# Patient Record
Sex: Male | Born: 1960 | Race: White | Hispanic: No | Marital: Married | Smoking: Former smoker
Health system: Southern US, Academic
[De-identification: ages and names within clinical notes are randomized; demographics above are authoritative.]

## PROBLEM LIST (undated history)

## (undated) DIAGNOSIS — K5090 Crohn's disease, unspecified, without complications: Secondary | ICD-10-CM

## (undated) DIAGNOSIS — K219 Gastro-esophageal reflux disease without esophagitis: Secondary | ICD-10-CM

## (undated) DIAGNOSIS — D333 Benign neoplasm of cranial nerves: Secondary | ICD-10-CM

## (undated) DIAGNOSIS — F410 Panic disorder [episodic paroxysmal anxiety] without agoraphobia: Secondary | ICD-10-CM

## (undated) DIAGNOSIS — K760 Fatty (change of) liver, not elsewhere classified: Secondary | ICD-10-CM

## (undated) HISTORY — DX: Crohn's disease, unspecified, without complications: K50.90

## (undated) HISTORY — PX: HX NO SURGICAL PROCEDURES: 2100001501

## (undated) HISTORY — DX: Panic disorder (episodic paroxysmal anxiety): F41.0

## (undated) HISTORY — DX: Benign neoplasm of cranial nerves: D33.3

## (undated) HISTORY — DX: Gastro-esophageal reflux disease without esophagitis: K21.9

## (undated) HISTORY — PX: SKIN CANCER EXCISION: SHX779

## (undated) HISTORY — DX: Fatty (change of) liver, not elsewhere classified: K76.0

---

## 1999-09-24 ENCOUNTER — Other Ambulatory Visit (HOSPITAL_COMMUNITY): Payer: Self-pay | Admitting: Family Medicine

## 2017-02-10 ENCOUNTER — Ambulatory Visit (INDEPENDENT_AMBULATORY_CARE_PROVIDER_SITE_OTHER): Payer: MEDICAID | Admitting: Rheumatology

## 2017-02-10 ENCOUNTER — Ambulatory Visit: Payer: MEDICAID | Attending: Gastroenterology | Admitting: Gastroenterology

## 2017-02-10 ENCOUNTER — Encounter (INDEPENDENT_AMBULATORY_CARE_PROVIDER_SITE_OTHER): Payer: Self-pay | Admitting: Gastroenterology

## 2017-02-10 VITALS — BP 137/96 | HR 53 | Temp 96.4°F | Ht 71.0 in | Wt 202.4 lb

## 2017-02-10 DIAGNOSIS — F410 Panic disorder [episodic paroxysmal anxiety] without agoraphobia: Secondary | ICD-10-CM | POA: Insufficient documentation

## 2017-02-10 DIAGNOSIS — K760 Fatty (change of) liver, not elsewhere classified: Secondary | ICD-10-CM

## 2017-02-10 DIAGNOSIS — K449 Diaphragmatic hernia without obstruction or gangrene: Secondary | ICD-10-CM | POA: Insufficient documentation

## 2017-02-10 DIAGNOSIS — Z87891 Personal history of nicotine dependence: Secondary | ICD-10-CM | POA: Insufficient documentation

## 2017-02-10 DIAGNOSIS — K219 Gastro-esophageal reflux disease without esophagitis: Secondary | ICD-10-CM | POA: Insufficient documentation

## 2017-02-10 DIAGNOSIS — Z79899 Other long term (current) drug therapy: Secondary | ICD-10-CM | POA: Insufficient documentation

## 2017-02-10 LAB — PT/INR
INR: 0.93 (ref 0.80–1.20)
INR: 0.93 (ref 0.80–1.20)
PROTHROMBIN TIME: 10.8 s (ref 9.3–13.9)

## 2017-02-10 LAB — BASIC METABOLIC PANEL
ANION GAP: 8 mmol/L (ref 4–13)
BUN/CREA RATIO: 11 (ref 6–22)
BUN/CREA RATIO: 11 (ref 6–22)
BUN: 10 mg/dL (ref 8–25)
CALCIUM: 10.1 mg/dL (ref 8.5–10.2)
CHLORIDE: 103 mmol/L (ref 96–111)
CO2 TOTAL: 28 mmol/L (ref 22–32)
CREATININE: 0.94 mg/dL (ref 0.62–1.27)
ESTIMATED GFR: >59 mL/min/1.73mˆ2 (ref >59)
GLUCOSE: 97 mg/dL (ref 65–139)
POTASSIUM: 4.1 mmol/L (ref 3.5–5.1)
SODIUM: 139 mmol/L (ref 136–145)

## 2017-02-10 LAB — HEPATIC FUNCTION PANEL
ALBUMIN: 4.6 g/dL (ref 3.5–5.0)
ALKALINE PHOSPHATASE: 95 U/L (ref <150)
BILIRUBIN DIRECT: 0.2 mg/dL (ref <0.3)
BILIRUBIN TOTAL: 0.7 mg/dL (ref 0.3–1.3)

## 2017-02-10 LAB — CBC
HCT: 49.2 % — ABNORMAL HIGH (ref 36.7–47.0)
HGB: 16.4 g/dL — ABNORMAL HIGH (ref 12.5–16.3)
HGB: 16.4 g/dL — ABNORMAL HIGH (ref 12.5–16.3)
MCH: 28 pg (ref 27.4–33.0)
MCHC: 33.3 g/dL (ref 32.5–35.8)
MCV: 84 fL (ref 78.0–100.0)
MPV: 7.4 fL — ABNORMAL LOW (ref 7.5–11.5)
PLATELETS: 347 x10ˆ3/uL (ref 140–450)
RBC: 5.86 x10ˆ6/uL — ABNORMAL HIGH (ref 4.06–5.63)
RDW: 13.7 % (ref 12.0–15.0)
WBC: 8.3 x10ˆ3/uL (ref 3.5–11.0)

## 2017-02-10 NOTE — H&P (Signed)
Digestive Diseases  Operated by [facility] Patient History and Physical     Date:   02/10/2017  Name: Roy [name]  Age: 56 y.o.    Chief Complaint: Fatty Liver    History of Present Illness  56 yo M w/ h/o (reported) fatty liver, (reported) "Chron-like" ileal inflammation, GERD, and panic disorder presents for consultation regarding a recent diagnosis of "stage IV liver failure" that he was provided. Patient follows with a gastroenterologist in [place], [place], who recently ordered two separate transient elastographies (FibroScan), which revealed "stage IV liver failure." Preceding the transient elastography, patient underwent serial liver ultrasounds that showed a "fatty liver" on each occasion. The liver ultrasounds were ordered after mildly elevated liver enzymes were detected on routine labs. Patient used to drink a maximum of 6 beers daily for approximately 5-years, until year 2000 (quit date). Denies IVDU. Reported negative screening for HCV. BMI is 28 (weight fairly stable). Denies prior chronic liver disease. Denies family h/o chronic liver disease. Denies OTC Tylenol usage. Denies ascites, bleeding, and confusion.    Patient also endorses a h/o ileal inflammation ("Crohn-like" illness) diagnosed in the year 2000. Prior to diagnosis, patient had been having intermittent loose stools and abdominal pain. Initially, patient was prescribed Asacol with symptomatic relief (decrease in episode frequency). On repeat colonoscopy (~3-years later), ileal inflammation was still present, and the patient was transitioned to Pentasa and Entocort. Patient has undergone a colonoscopy every 3 years since year 2000. Patient has had polyps removed more than once. Last colonoscopy performed 05/08/16. Following this colonoscopy, patient decreased dosing of Pentasa to 1,000 mg PO BID (from 2,000 mg PO BID). Patient takes Entocort 9 mg PO daily. When patient has a flare up, he has confusion, blurry vision, feeling of  warmth, abdominal pain, and loose stools. Flares now occur once monthly. Patient sees Dr. Netty Starring in [place], [place]. Patient speculates that his ileal inflammation may have been caused by 1 year of Accutane usage in 1993.    Patient takes Zoloft and Klonopin PRN for panic disorder. Patient takes Prilosec 20 mg PO daily for GERD associated with a hiatal hernia.    FH: Maternal grandmother (pancreatitis).    PSH: Denies.    SHx: Architect (previous). Dog boarder. Quit cigarettes (1 PPD < 10 years) in 1994.    Past Medical History  Past Medical History:   Diagnosis Date   . Esophageal reflux    . Fatty liver     Per liver ultrasounds   . Panic disorder     PSH: Denies prior surgery.     Current Outpatient Prescriptions   Medication Sig   . budesonide (ENTOCORT EC) 3 mg Oral Capsule, Delayed & Ext.Release Take 9 mg by mouth Every night   . cholestyramine-aspartame (PREVALITE) 4 gram Oral Powder in Packet Take 4 g by mouth Every evening with dinner   . clonazePAM (KLONOPIN) 1 mg Oral Tablet Take 0.5 mg by mouth if needed   . mesalamine (PENTASA) 500 mg Oral Capsule, Sustained Release Take 1,000 mg by mouth Twice daily   . omeprazole (PRILOSEC) 20 mg Oral Capsule, Delayed Release(E.C.) Take 20 mg by mouth Once a day   . sertraline (ZOLOFT) 100 mg Oral Tablet Take 100 mg by mouth Once a day     No Known Allergies    Family History  Family Medical History     Problem Relation (Age of Onset)    Pancreatitis Maternal Grandmother  Social History  Social History     Social History   . Marital status: Married     Spouse name: N/A   . Number of children: N/A   . 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 Smoking status: Former Smoker     Packs/day: 1.00     Years: 10.00     Types: Cigarettes     Quit date: 02/10/1993   . Smokeless tobacco: Never Used   . Alcohol use Not on file      Comment: Prior alcohol usage (6 pack beer daily in  mid-late 1990's); Quit date year 2000   . Drug use: Not on file   . Sexual activity: Not on file     Other Topics Concern   . Not on file     Social History Narrative   . No narrative on file     Review of Systems  - Constitutional: Denies fevers, unintentional weight loss.  - HEENT:  Denies swollen glands.  - CV:  Denies chest pain.  - Pulm.:  Denies shortness-of-breath.  - GI:   Endorses monthly episodes of abdominal pain and loose stools.  - GU:  Denies tea-colored urine.   - MSK:  Denies arthralgias.   - Derm: Denies skin lesions.  - Extremities: Denies swelling.  - Neurologic: Denies syncope.   - Heme: Denies easy bruising.     Examination:  BP (!) 137/96  Pulse 53  Temp 35.8 C (96.4 F)  Ht 1.803 m (5\' 11" )  Wt 91.8 kg (202 lb 6.1 oz)  SpO2 98%  BMI 28.23 kg/m2  General  - Appears stated age. No apparent distress.  Head  . NC/AT  Eyes  . Conjunctiva without pallor. No scleral icterus.  Throat  . Oropharynx moist & clear.  Lungs/Chest  . No chest wall lesions or scars.  . Non-labored breathing.  Heart  - Regular rhythm; Normal rate.  Abdomen  - Non-distended. No scars.  - Soft & non-tender.  - Liver edge palpable 1-cm below costal margin on expiration.  Neurology  - Alert & oriented x4. No asterixis.  Extremities  - Non-edematous. No palmar erythema.  Dermatology  - No lesions or rashes.  Hematology  - Good skin coloration (no paleness).    Data reviewed:  I have reviewed all labs, which are within normal limits.  CBC   Recent Labs      02/10/17   1424   WBC  8.3   HGB  16.4*   HCT  49.2*   PLTCNT  347        BMP   Recent Labs      02/10/17   1424   SODIUM  139   POTASSIUM  4.1   CHLORIDE  103   CO2  28   BUN  10   CREATININE  0.94   GLUCOSENF  97   ANIONGAP  8   BUNCRRATIO  11   GFR  >59      Recent Labs      02/10/17   1424   CALCIUM  10.1         LFTs   Recent Labs      02/10/17   1424   AST  17   ALT  28   ALKPHOS  95   TOTBILIRUBIN  0.7  BILIRUBINCON  0.2   TOTALPROTEIN  7.9   ALBUMIN  4.6           Coagulation Studies   Recent Labs      02/10/17   1424   PROTHROMTME  10.8   INR  0.93        Assessment and Plan  56 yo M w/ 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    We explained to the patient that based on his history, physical exam, and pan-normal laboratory values, it is very unlikely that he has underlying cirrhosis. We did explain that a small percentage of patients can have underlying cirrhosis despite normal exam and labs. We told the patient that he most likely has some degree of hepatic steatosis (likely metabolic in origin with possibly some contribution from prior drinking). Fatty liver is supported by verbal report of prior liver ultrasound reports and per physical exam (mild hepatomegaly).    1. Fatty liver    2. Gastroesophageal reflux disease, esophagitis presence not specified        Fatty liver disease  - Requested record of prior liver ultrasounds, transient elastographies, and office notes from the offices of Drs Peri Maris (Fax: [phone]) and Netty Starring (Fax: [phone]).  - Explained to the patient that if there is any uncertainty regarding the exclusion of cirrhosis pending record review, that a liver biopsy would be warranted.  - Variceal screening via EGD not indicated at this point (i.e., insufficient evidence for cirrhosis at this time).    Reported H/O ileal inflammation  - Requested colonoscopy and biopsy report from the office of Dr. Netty Starring (Fax: [phone]).  - Given that Crohn disease has never been confirmed (per the patient), and given that symptoms are overall well-controlled at this time, recommend maintenance with mesalamine product and gradual taper of budesonide (Entocort), which has some amount of systemic absorption (and  thus steroid side effects).      Murrell Converse, MD  02/10/2017, 12:58  Scotty Court, MD  Fellow, [facility]    ATTENDING ATTESTATION:  I have seen and examined this patient on 02/10/2017.  I have reviewed and discussed the case with Dr. Jobe Marker and agree with the assessment and plan of care as written.  For further detail see above note.   Scarlette Slice, MD

## 2017-02-11 ENCOUNTER — Encounter (INDEPENDENT_AMBULATORY_CARE_PROVIDER_SITE_OTHER): Payer: Self-pay | Admitting: Gastroenterology

## 2017-02-11 NOTE — Nursing Note (Signed)
Received Pt records from Dr. Netty Starring, [place], [place]. Placing in Dr. Freada Bergeron clinic folder. Scanning to chart.  Matilde Sprang, MA  02/11/2017, 18:07

## 2017-03-09 ENCOUNTER — Encounter (INDEPENDENT_AMBULATORY_CARE_PROVIDER_SITE_OTHER): Payer: Self-pay | Admitting: Gastroenterology

## 2017-03-09 NOTE — Nursing Note (Signed)
Received records from Dr. Sallye Ober office. Placing in Dr. Freada Bergeron clinic folder to be reviewed. Scanning to chart.  Matilde Sprang, MA  03/09/2017, 13:55

## 2017-03-12 ENCOUNTER — Ambulatory Visit (INDEPENDENT_AMBULATORY_CARE_PROVIDER_SITE_OTHER): Payer: Self-pay | Admitting: Gastroenterology

## 2017-03-12 NOTE — Telephone Encounter (Signed)
Regarding: results   ----- Message from Varney Daily sent at 03/12/2017 11:07 AM EDT -----  Dr. Tyrone Nine    Patient calling for lab results, please advise

## 2017-04-13 ENCOUNTER — Encounter (INDEPENDENT_AMBULATORY_CARE_PROVIDER_SITE_OTHER): Payer: Self-pay | Admitting: Gastroenterology

## 2017-04-13 NOTE — Nursing Note (Signed)
Received Progress notes from Dr. Serita Grit office. Placing in Dr. Freada Bergeron clinic folder to be reviewed.  Matilde Sprang, MA  04/13/2017, 12:23

## 2017-04-20 ENCOUNTER — Encounter (INDEPENDENT_AMBULATORY_CARE_PROVIDER_SITE_OTHER): Payer: Self-pay | Admitting: Gastroenterology

## 2017-04-20 NOTE — Progress Notes (Signed)
Outside records received and reviewed:    1. Colonoscopy 05/14/2016. Mild enteritis in TI, normal colon.  2. Pathology: Mild nonspecific chronic inflammation in ileum, no active pathology; Mild nonspecific chronic colitis no histologic evidence Crohn's.  3. Fibroscan 12/24/2016: F4

## 2017-10-02 ENCOUNTER — Ambulatory Visit (INDEPENDENT_AMBULATORY_CARE_PROVIDER_SITE_OTHER): Payer: MEDICAID | Admitting: Otolaryngology

## 2017-10-02 ENCOUNTER — Ambulatory Visit (INDEPENDENT_AMBULATORY_CARE_PROVIDER_SITE_OTHER): Payer: MEDICAID | Admitting: Audiologist

## 2017-10-02 ENCOUNTER — Encounter (INDEPENDENT_AMBULATORY_CARE_PROVIDER_SITE_OTHER): Payer: Self-pay | Admitting: Otolaryngology

## 2017-10-02 VITALS — BP 130/84 | HR 50 | Temp 97.7°F | Ht 71.0 in | Wt 198.4 lb

## 2017-10-02 DIAGNOSIS — D333 Benign neoplasm of cranial nerves: Secondary | ICD-10-CM

## 2017-10-02 DIAGNOSIS — H905 Unspecified sensorineural hearing loss: Secondary | ICD-10-CM

## 2017-10-02 DIAGNOSIS — H6122 Impacted cerumen, left ear: Secondary | ICD-10-CM

## 2017-10-02 DIAGNOSIS — H9312 Tinnitus, left ear: Principal | ICD-10-CM

## 2017-10-02 DIAGNOSIS — H903 Sensorineural hearing loss, bilateral: Secondary | ICD-10-CM

## 2017-10-02 DIAGNOSIS — H9190 Unspecified hearing loss, unspecified ear: Secondary | ICD-10-CM

## 2017-10-02 DIAGNOSIS — R42 Dizziness and giddiness: Secondary | ICD-10-CM

## 2017-10-02 DIAGNOSIS — H6120 Impacted cerumen, unspecified ear: Secondary | ICD-10-CM

## 2017-10-02 DIAGNOSIS — H9041 Sensorineural hearing loss, unilateral, right ear, with unrestricted hearing on the contralateral side: Secondary | ICD-10-CM

## 2017-10-02 NOTE — H&P (Signed)
PATIENT NAME:  Roy [name]  MRN:  [id number]  DOB:  June 02, 1961  DATE OF SERVICE:  10/02/2017      Chief Complaint:    Chief Complaint   Patient presents with   . Acoustic Neuroma     RT       HPI:  Latonya Knight is a 57 y.o. 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  I am seeing him in consultation from Dr. Cyndia Diver in [place].  He had an episode of sudden hearing loss in the right ear in September 2018.  This was treated with antibiotics and steroids.  He feels his hearing is unchanged since that time.  He complains of persistent right tinnitus and fullness.  Left is the better hearing ear.  He denies hearing loss on the left.  He has had 4-5 vertigo attacks since December 2018.  He also complains of a tilting, falling sensation.  He also complains of baseline imbalance.  No history of ear infections or surgery.  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      Past Medical History:   Diagnosis Date   . Esophageal reflux    . Fatty liver     Per liver ultrasounds   . Panic disorder            Past Surgical History:   Procedure Laterality Date   . HX NO SURGICAL PROCEDURES             Medications:  Current Outpatient Medications   Medication Sig   . budesonide (ENTOCORT EC) 3 mg Oral Capsule, Delayed & Ext.Release Take 9 mg by mouth Every night   . cholestyramine-aspartame (PREVALITE) 4 gram Oral Powder in Packet Take 4 g by mouth Every evening with dinner   . citalopram (CELEXA) 20 mg Oral Tablet Take 20 mg by mouth Once a day   . clonazePAM (KLONOPIN) 1 mg Oral Tablet Take 0.5 mg by mouth if needed   . mesalamine (PENTASA) 500 mg Oral Capsule, Sustained Release Take 1,000 mg by mouth Twice daily   . omeprazole (PRILOSEC) 20 mg Oral Capsule, Delayed Release(E.C.) Take 20 mg by mouth Once a day   . sertraline (ZOLOFT) 100 mg Oral Tablet Take 100 mg by mouth Once a day       Allergies:  No Known Allergies    Family Medical History:     Problem Relation (Age of Onset)    Pancreatitis Maternal  Grandmother              Social History     Socioeconomic History   . Marital status: Married     Spouse name: Not on file   . Number of children: Not on file   . Years of education: Not on file   . Highest education level: Not on file   Occupational History   . 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 Smoking status: Former Smoker     Packs/day: 1.00     Years: 10.00     Pack years: 10.00     Types: Cigarettes     Last attempt to quit: 02/10/1993     Years since quitting: 24.6   . Smokeless tobacco: Never Used       Review of Systems:  All other systems reviewed and found to be negative.    Physical Exam:  Blood pressure 130/84, pulse 50, temperature 36.5 C (97.7 F), temperature source Thermal Scan, height 1.803 m (5\' 11" ), weight 90 kg (198 lb 6.6 oz), SpO2 97 %.  Constitutional: no apparent distress  Eyes: EOMI    Ears: Binocular microscopy performed.   Right: EAC and TM clear.    Left: EAC impacted with wax, removed.  EAC and TM clear after ear cleaning.      Heme/Lymph: No cervical lymphadenopathy  Skin: warm, dry    Neurologic: Gait, Tandem gait and Romberg normal.  Cranial nerve V1-3 okay.  Lower cranial nerves okay.    Tuning [place]ks:  Weber: lateralizes to left  Rinne   Right: 512 AC>BC  Left: 512 AC>BC     Musculoskeletal: Moving all extremities  Psych: Alert and aware    Audiogram from today reviewed and interpreted  Type: Mid to high frequency sensorineural hearing loss on right.   PTA   Right: 22 dB   Left: 8 dB  SRT   Right: 25 dB   Left: 10 dB  SDS   Right: 100 %   Left: 100 %  Tymp   Right: type A (normal)    Left: type A (normal)    MRI 09/11/17 of brain with/without contrast, reviewed and interpreted.  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  Distance measures 1.5 cm in the transverse dimension, touches brainstem but no compression.  No fundal fluid.  Left side normal.    Assessment:  (1) Right acoustic  neuroma.  (2) Right sensorineural hearing loss.      Plan:  (1) We discussed options of observation versus stereotatic radiosurgery versus microsurgery.  He is interested in microsurgical excision.  We discussed all of the treatment options in detail with relative benefits and drawbacks.  We will order ABR from Dr. Benedetto Goad office, and he is to fax the results to me.  In the meantime he elects to observe, unless he wants to proceed forth with surgery in the meantime.  He will return to see Dr. Oliver Hum in six months in neurosurgery with repeat MRI of the IAC.  He understands that I will be moving in two months, and that I will be unable to assist with the surgical procedure.    Hilliard Clark, MD   Department of Otolaryngology    PCP: Peri Maris, DO  [place] [id number]  REF: Dia Sitter, [place] [place], [place] [address]  [place]  5.6.19

## 2017-10-02 NOTE — Procedures (Signed)
AUDIOGRAM  Pt has reports of vertigo & lightheadedness along with right ear tinnitus and HL. Records state RT CPA tumor present. Pt reports dizziness is better, but still triggers with head back in the shower, sitting down at end of the day, and quick movements. Pt also reports right ear fullness sensation. Audio reveals WNL to a moderate degree of SNHL; AS. Audio reveals WNL to a moderately severe degree of SNHL; AD. Word recognition is excellent; AU. Type A tymp consistent with normal middle ear function; AU.  Weber lateralized left & Rinne' is AC > BC; AU.   Rec: ENT f/u, audio prn.          Diane C. Rice, AuD

## 2017-10-05 ENCOUNTER — Encounter

## 2017-10-06 ENCOUNTER — Ambulatory Visit (INDEPENDENT_AMBULATORY_CARE_PROVIDER_SITE_OTHER): Payer: Self-pay | Admitting: Otolaryngology

## 2017-10-06 NOTE — Telephone Encounter (Signed)
Per note, "We will order ABR from Dr. Benedetto Goad office, and he is to fax the results to me." Attempted to return call but [facility] in with pt. Staff will have her return my call.  Enid Skeens, RN 10/06/2017 10:40

## 2017-10-06 NOTE — Telephone Encounter (Signed)
Regarding: DR. Landis Gandy   ----- Message from Sanjuan Dame sent at 10/05/2017  4:06 PM EDT -----  Roy [name] was calling back in to ask about the order they received on this pt for a hearing test to see what we were wanting done. Looks like pt saw Dr. Landis Gandy and had a hearing test with [place] on 5.3.19. Please return call to advise. Thank you.     ----- Message from Clemon Chambers sent at 10/05/2017 12:10 PM EDT -----  DR. CASSIS,    Dr. Garth Schlatter Office calling for clarification of Orders received ///  Please   Return call to [facility]: [phone] Ask for [facility] ////

## 2017-10-14 NOTE — Telephone Encounter (Signed)
Spoke with Romelle Starcher who states pt had VNG on March 28th. She wanted to ensure provider wants another VNG. Result in under media. Message to provider to confirm.  Enid Skeens, RN 10/14/2017 15:41

## 2017-10-15 NOTE — Telephone Encounter (Signed)
[name], Roy Blackwater, MD  Shon Hough Gardiner Rhyme, RN   Caller: Unspecified (1 week ago)            Looks like I ordered ABR only. No VNG needed   AC      Spoke with bethany and advised. She denies further questions or needs at this time.  Enid Skeens, RN 10/15/2017 09:14

## 2017-10-23 ENCOUNTER — Ambulatory Visit (INDEPENDENT_AMBULATORY_CARE_PROVIDER_SITE_OTHER): Payer: Self-pay | Admitting: Otolaryngology

## 2017-10-23 NOTE — Telephone Encounter (Signed)
Per note, "He is interested in microsurgical excision.Marland KitchenMarland KitchenIn the meantime he elects to observe, unless he wants to proceed forth with surgery in the meantime.Marland KitchenMarland KitchenHe understands that I will be moving in two months, and that I will be unable to assist with the surgical procedure" Pt scheduled that day for f/u in Oct with Dr.Kellermyer. Spoke with pt wife. She states that pt would like to go forward with surgical option. I advised that Dr.Cassis' surgical schedule is full and pt will not be able to have surgery with Dr.Cassis, however, he is scheduled with Dr.Kellermyer. She ask if this could be scheduled for sooner appt. Scheduled for next available appt. MRI scheduling number given for coordination of MRI AIC same day.  Enid Skeens, RN 10/23/2017 14:16

## 2017-10-23 NOTE — Telephone Encounter (Signed)
Regarding: Roy [name]   ----- Message from Salley Hews sent at 10/23/2017 12:12 PM EDT -----  Roy [name]     Pt called to ask if he may be seen sooner for a consult for surgery so that he may have his surgey done prior to Dr. Landis Gandy leaving . Pt advised that his wife Vishruth Seoane has permission to speak on his behalf , her phone number is [phone]    Please advise  Thank you

## 2017-10-27 ENCOUNTER — Other Ambulatory Visit (INDEPENDENT_AMBULATORY_CARE_PROVIDER_SITE_OTHER): Payer: Self-pay | Admitting: Radiology

## 2017-10-27 ENCOUNTER — Ambulatory Visit (INDEPENDENT_AMBULATORY_CARE_PROVIDER_SITE_OTHER): Payer: Self-pay | Admitting: Neurological Surgery

## 2018-01-28 ENCOUNTER — Encounter (INDEPENDENT_AMBULATORY_CARE_PROVIDER_SITE_OTHER): Payer: Self-pay

## 2018-01-28 ENCOUNTER — Encounter (INDEPENDENT_AMBULATORY_CARE_PROVIDER_SITE_OTHER): Payer: Self-pay | Admitting: Otolaryngology

## 2018-01-28 ENCOUNTER — Ambulatory Visit
Admission: RE | Admit: 2018-01-28 | Discharge: 2018-01-28 | Disposition: A | Discharge status: Home / Self Care | Payer: MEDICAID | Source: Ambulatory Visit | Attending: Otolaryngology | Admitting: Otolaryngology

## 2018-01-28 ENCOUNTER — Ambulatory Visit (HOSPITAL_BASED_OUTPATIENT_CLINIC_OR_DEPARTMENT_OTHER): Payer: MEDICAID | Admitting: Otolaryngology

## 2018-01-28 VITALS — BP 132/84 | HR 62 | Temp 97.9°F | Ht 71.0 in | Wt 199.3 lb

## 2018-01-28 DIAGNOSIS — H9319 Tinnitus, unspecified ear: Secondary | ICD-10-CM

## 2018-01-28 DIAGNOSIS — D333 Benign neoplasm of cranial nerves: Secondary | ICD-10-CM

## 2018-01-28 DIAGNOSIS — R42 Dizziness and giddiness: Secondary | ICD-10-CM

## 2018-01-28 MED ORDER — MECLIZINE 25 MG TABLET
25.00 mg | ORAL_TABLET | Freq: Two times a day (BID) | ORAL | 1 refills | Status: AC | PRN
Start: 2018-01-28 — End: 2018-02-27

## 2018-01-28 MED ORDER — GADOBUTROL 10 MMOL/10 ML (1 MMOL/ML) INTRAVENOUS SOLUTION
9.00 mL | INTRAVENOUS | Status: AC
Start: 2018-01-28 — End: 2018-01-28
  Administered 2018-01-28: 13:00:00 9 mL via INTRAVENOUS

## 2018-01-28 MED ADMIN — sodium chloride 0.9 % intravenous solution: INTRAVENOUS | @ 13:00:00 | NDC 00338004904

## 2018-01-28 NOTE — Progress Notes (Signed)
PATIENT NAME:  Roy [name]  MRN:  [id number]  DOB:  23-Oct-1960  DATE OF SERVICE: 01/28/2018    HPI:  [name] is a 57 y.o. year old male With history of right-sided acoustic neuroma.  This had been diagnosis last September by Dr. Herbert Seta.  He had hearing loss and subsequent MRI showed a 1 cm Q-stick on the right.  He was treated with steroids and antibiotics with no change in hearing.  He has had some intermittent dizziness occurring a few times a week.  He has had about 5 vertigo spells since December.  He had a repeat MRI today to monitor any growth.  He denies any double vision, numbness, tingling of the face and no major change in hearing since last fall.        Physical Exam:  Blood pressure 132/84, pulse 62, temperature 36.6 C (97.9 F), temperature source Thermal Scan, height 1.803 m (5\' 11" ), weight 90.4 kg (199 lb 4.7 oz).  Body mass index is 27.8 kg/m.  General Appearance: Pleasant, cooperative, healthy, and in no acute distress.  Eyes: Conjunctivae/corneas clear, EOM's intact.  Head and Face: Normocephalic, atraumatic.  Face symmetric, no obvious lesions.   Pinnae: Normal shape and position.   External auditory canals:  Patent without inflammation.  Tympanic membranes:  Intact, translucent, midposition, middle ear aerated.  Nose:  External pyramid midline. Septum midline. Mucosa normal. No purulence, polyps, or crusts.   Oral Cavity/Oropharynx: No mucosal lesions, masses, or pharyngeal asymmetry.  Neurologic: Cranial nerves:  grossly intact.  Psychiatric:  Alert and oriented x 3.    Procedure:  No notes on file    Data Reviewed:  MRI IAC - 9 x 15 x 9 mm acoustic neuroma, 15 mm in transverse measurment, appears similar in size to April MRI.  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  VNG normal outside facility and still has servicable hearing.  Discussed surgical, gamma knife, observation options he would like to think about it at this  time.    Plan:  Patient to discuss with Dr. Oliver Hum about surgery vs gamma knife vs observation.    F/U with me same day    Lenoard Aden, MD 01/28/2018, 15:11    PCP:  Peri Maris, DO  [address]  [place] [address]   REF:  No referring provider defined for this encounter.

## 2018-03-05 ENCOUNTER — Encounter (INDEPENDENT_AMBULATORY_CARE_PROVIDER_SITE_OTHER): Payer: Self-pay | Admitting: Otolaryngology

## 2018-03-05 ENCOUNTER — Other Ambulatory Visit (INDEPENDENT_AMBULATORY_CARE_PROVIDER_SITE_OTHER): Payer: Self-pay

## 2018-03-08 ENCOUNTER — Encounter (INDEPENDENT_AMBULATORY_CARE_PROVIDER_SITE_OTHER): Payer: Self-pay | Admitting: Neurological Surgery

## 2018-03-09 ENCOUNTER — Encounter (INDEPENDENT_AMBULATORY_CARE_PROVIDER_SITE_OTHER): Payer: Self-pay | Admitting: Otolaryngology

## 2018-03-09 ENCOUNTER — Other Ambulatory Visit (INDEPENDENT_AMBULATORY_CARE_PROVIDER_SITE_OTHER): Payer: Self-pay

## 2018-03-24 ENCOUNTER — Ambulatory Visit: Payer: Medicaid Other | Attending: Neurological Surgery | Admitting: Neurological Surgery

## 2018-03-24 ENCOUNTER — Encounter (INDEPENDENT_AMBULATORY_CARE_PROVIDER_SITE_OTHER): Payer: Self-pay | Admitting: Neurological Surgery

## 2018-03-24 DIAGNOSIS — Z87891 Personal history of nicotine dependence: Secondary | ICD-10-CM | POA: Insufficient documentation

## 2018-03-24 DIAGNOSIS — Z6828 Body mass index (BMI) 28.0-28.9, adult: Secondary | ICD-10-CM

## 2018-03-24 DIAGNOSIS — D333 Benign neoplasm of cranial nerves: Secondary | ICD-10-CM | POA: Insufficient documentation

## 2018-03-24 NOTE — Progress Notes (Signed)
[facility]  New Outpatient/Consult    Roy [name]  Date of Service: 03/24/2018  Referring physician: Hilliard Clark, MD  [address]  [place], [place] [address]    Gender: male  Handedness: Right handed  Marital Status: Married   Job Title (or Former Job): had been self employed Copywriter, advertising Complaint:   Chief Complaint   Patient presents with   . Other     follow up     History is provided by patient    History of Present Illness  57 year old right handed male who is here for evaluation of his brain imaging.    The patient noted sudden decrease in right sided hearing in September 2018 (no change with abx and steroids). His local ENT in [place] referred him to Dr. Landis Gandy (formerly of [facility]) in May 2019. VNG from outside facility was normal and he still has serviceable hearing. Mri showed a small lesion: here for review.    He reports a decrease in right sided hearing (but is still able to use a phone on that side), tinnitus, balance trouble with associated nausea (gets a "back and forth shaking" feeling when he gets up in the morning; Antivert had helped initially), intermittent right facial heaviness and fullness with an occasional numbness in the right V2 distribution. The balance trouble seems to worsen with neck flexion (he was switched from Klonopin to Valium, for his Crohn's, to see if this helps). He gets intermittent confusion.  The patient denies h/a, syncope, sz, unilateral weakness/numbness, recent falls/injuries, dysphagia, or incontinence.    Past History    Current Outpatient Medications:   .  budesonide (ENTOCORT EC) 3 mg Oral Capsule, Delayed & Ext.Release, Take 9 mg by mouth Every night, Disp: , Rfl:   .  cholestyramine-aspartame (PREVALITE) 4 gram Oral Powder in Packet, Take 4 g by mouth Every evening with dinner, Disp: , Rfl:   .  citalopram (CELEXA) 20 mg Oral Tablet, Take 20 mg by mouth Once a day, Disp: , Rfl:   .  clonazePAM (KLONOPIN) 1 mg Oral  Tablet, Take 0.5 mg by mouth if needed, Disp: , Rfl:   .  hydroCHLOROthiazide (HYDRODIURIL) 25 mg Oral Tablet, Take 25 mg by mouth Once a day , Disp: , Rfl: 0  .  mesalamine (PENTASA) 500 mg Oral Capsule, Sustained Release, Take 1,000 mg by mouth Twice daily, Disp: , Rfl:   .  omeprazole (PRILOSEC) 20 mg Oral Capsule, Delayed Release(E.C.), Take 20 mg by mouth Once a day, Disp: , Rfl:   .  sertraline (ZOLOFT) 100 mg Oral Tablet, Take 100 mg by mouth Once a day, Disp: , Rfl:   No Known Allergies  Past Medical History:   Diagnosis Date   . Crohn's disease (CMS [place] [place])    . Esophageal reflux    . Fatty liver     Per liver ultrasounds   . Panic disorder    . 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 HX NO SURGICAL PROCEDURES         Family History  Family Medical History:     Problem Relation (Age of Onset)    Pancreatitis Maternal Grandmother              Social History  Social History     Socioeconomic History   .  Marital status: Married     Spouse name: Not on file   . Number of children: 0   . Years of education: Not on file   . Highest education level: Not on file   Occupational History   . 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 Smoking status: Former Smoker     Packs/day: 1.00     Years: 10.00     Pack years: 10.00     Types: Cigarettes     Last attempt to quit: 02/10/1993     Years since quitting: 25.1   . Smokeless tobacco: Never Used   Other Topics Concern   . Right hand dominant Yes       Review of Systems  Other than ROS in the HPI, all other systems were negative.    Examination  BP 140/80   Pulse 76   Temp 37 C (98.6 F) (Thermal Scan)   Ht 1.8 m (5' 10.87")   Wt 92.1 kg (203 lb 0.7 oz)   SpO2 99%   BMI 28.43 kg/m         Constitutional  General appearance: Normal  Eyes: Ophthalmic exam of optic discs and posterior segments: difficulty visualizing fundi   HENT:  Normal  Skin:  Normal  Hem/Lymph:   Normal  Respiratory:  Normal  Gastrointestinal:  Normal  Cardiovascular:   Carotid arteries: Normal  Musculoskeletal  Gait and Station: : Normal  Muscle strength (upper extremities): : Normal  Muscle strength (lower extremities): : Normal  Muscle tone (upper extremities): : Normal  Muscle tone (lower extremities): : Normal  Sensation: Normal  Deep tendon reflexes upper and lower extremities: Normal  Coordination: Normal  Neurological  Orientation: Normal  Recent and remote memory: Normal  Attention span and concentration: Normal  Language: Normal  Fund of knowledge: Normal  Cranial Nerves  2nd: Normal  3rd,4th,6th: Normal  5th: Normal  7th: Normal  8th: decreased to finger rub on right   9th: Normal  11th: Normal  12th: Normal    Data reviewed  MRI IAC w/wo contrast 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    Assessment:      ICD-10-CM    1. 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    -The natural history, film findings, and indications for treatment were discussed.  -Tx options of following with serial imaging, GK, vs surgery were discussed.  -Given that his hearing is preserved, he would like to continue to monitor.  -RTC in August 2020 (which is 1 yr from last study) with MRI IAC w/wo contrast, sooner if problems develop.   -F/u with ENT if planned.     -A copy of the note from today's clinic appointment will be sent via fax or mail to the patient's PCP and/or referring physician on file.       Sharman Crate, PA-C 03/26/2018, 14:28    With Dr. Oliver Hum    I personally saw and examined the patient. See the mid-level provider's note for additional details. My findings are as follows:    History:  Workup for hearing loss last year revealed tumor. Has unsteadiness as well. Can still hear with that side.    Exam:  Awake alert motor  intact.  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    Imp:  Small right acoustic neuroma. Discussed options of following, vs GK or surgery.     Plan:  Since he has preserved hearing, will plan to see back one year after last MRI with new scan.      Jarvis Morgan, MD 03/26/2018, 14:28

## 2018-10-06 ENCOUNTER — Other Ambulatory Visit (INDEPENDENT_AMBULATORY_CARE_PROVIDER_SITE_OTHER): Payer: Self-pay

## 2019-04-13 ENCOUNTER — Ambulatory Visit (INDEPENDENT_AMBULATORY_CARE_PROVIDER_SITE_OTHER): Payer: Self-pay | Admitting: Neurological Surgery

## 2019-04-13 ENCOUNTER — Ambulatory Visit (HOSPITAL_BASED_OUTPATIENT_CLINIC_OR_DEPARTMENT_OTHER): Payer: Medicaid Other | Admitting: Neurological Surgery

## 2019-04-13 ENCOUNTER — Other Ambulatory Visit: Payer: Self-pay

## 2019-04-13 ENCOUNTER — Encounter (INDEPENDENT_AMBULATORY_CARE_PROVIDER_SITE_OTHER): Payer: Self-pay | Admitting: Neurological Surgery

## 2019-04-13 ENCOUNTER — Ambulatory Visit (INDEPENDENT_AMBULATORY_CARE_PROVIDER_SITE_OTHER): Payer: Self-pay | Admitting: Otolaryngology

## 2019-04-13 ENCOUNTER — Ambulatory Visit
Admission: RE | Admit: 2019-04-13 | Discharge: 2019-04-13 | Disposition: A | Discharge status: Home / Self Care | Payer: Medicaid Other | Source: Ambulatory Visit | Attending: Neurological Surgery | Admitting: Neurological Surgery

## 2019-04-13 VITALS — BP 140/88 | HR 58 | Temp 97.7°F | Ht 71.0 in | Wt 200.4 lb

## 2019-04-13 DIAGNOSIS — Z6827 Body mass index (BMI) 27.0-27.9, adult: Secondary | ICD-10-CM

## 2019-04-13 DIAGNOSIS — D333 Benign neoplasm of cranial nerves: Secondary | ICD-10-CM

## 2019-04-13 MED ORDER — GADOBUTROL 10 MMOL/10 ML (1 MMOL/ML) INTRAVENOUS SOLUTION
9.0000 mL | INTRAVENOUS | Status: AC
Start: 2019-04-13 — End: 2019-04-13
  Administered 2019-04-13: 13:00:00 9 mL via INTRAVENOUS

## 2019-04-13 NOTE — Progress Notes (Signed)
[facility]  Return Outpatient Note    Date:  04/13/2019  Age:  58 y.o.  Referring Physician:   Hilliard Clark, MD  [address]  [place], [place] [address]         Subjective:   Chief Complaint:   Chief Complaint   Patient presents with   . New Patient     acoustic neuroma w MRI prior        Yetzael [name] is a 58 y.o.  male here for follow up of AN with new MRI brain for review.    The patient noted sudden decrease in right sided hearing in September 2018 (no change with abx and steroids). His local ENT in [place] referred him to Dr. Landis Gandy (formerly of [facility]) in May 2019. VNG from outside facility was normal and he still has serviceable hearing.     The patient was last in clinic on 03/24/18. Discussed serial imaging, GK and surgery. Patient opted for serial imaging. Plan at the time was for 1 year follow up with new MRI. Here with new MRI. Presents with his sister in law who contributes to the hx. Still having right sided hearing loss, tinnitus (R>L). Some balance issues at times if tired or when first waking up. Denies pain today. Occasional rare HA. The patient denies acute vision changes, syncope, sz, unilateral weakness/numbness, acute cognitive changes, recent falls/injuries, dysphagia, or incontinence. Presents with his sister in law today. PMH significant for HTN.  Tobacco status - none      Current Outpatient Medications:   .  budesonide (ENTOCORT EC) 3 mg Oral Capsule, Delayed & Ext.Release, Take 9 mg by mouth Every night, Disp: , Rfl:   .  cholestyramine-aspartame (PREVALITE) 4 gram Oral Powder in Packet, Take 4 g by mouth Every evening with dinner, Disp: , Rfl:   .  citalopram (CELEXA) 20 mg Oral Tablet, Take 20 mg by mouth Once a day, Disp: , Rfl:   .  clonazePAM (KLONOPIN) 1 mg Oral Tablet, Take 0.5 mg by mouth if needed, Disp: , Rfl:   .  hydroCHLOROthiazide (HYDRODIURIL) 25 mg Oral Tablet, Take 25 mg by mouth Once a day , Disp: , Rfl: 0  .  mesalamine (PENTASA)  500 mg Oral Capsule, Sustained Release, Take 1,000 mg by mouth Twice daily, Disp: , Rfl:   .  omeprazole (PRILOSEC) 20 mg Oral Capsule, Delayed Release(E.C.), Take 20 mg by mouth Once a day, Disp: , Rfl:   .  sertraline (ZOLOFT) 100 mg Oral Tablet, Take 100 mg by mouth Once a day, Disp: , Rfl:   No current facility-administered medications for this visit.     Objective:   Vital Signs:  BP 140/88   Pulse 58   Temp 36.5 C (97.7 F) (Temporal)   Ht 1.803 m (5\' 11" )   Wt 90.9 kg (200 lb 6.4 oz)   SpO2 100%   BMI 27.95 kg/m   Examination  Constitutional:Well groomed, in no apparent distress  Skin: Warm and dry  Head:  NC/AT   Eyes: Conjunctiva clear, + red reflex, optic discs and posterior segments - difficult to visualize, no obvious abnormalities  ENT:  Tongue midline, trachea midline  Cardiovascular:    Pulses palpable, no peripheral edema  Respiratory: Unlabored  Musculoskeletal  Gait and Station: slow and steady with no ataxia  Muscle strength (upper extremities): 5/5 bilaterally  Muscle strength (lower extremities): 5/5 bilaterally  Muscle tone (upper extremities): WNL  Muscle tone (lower extremities): WNL  Sensory: Sensory exam in the upper and lower extremities is normal  Coordination: Normal rapid alternating movements and finger to nose intact. No pronator drift.   Neurological  Level of consciousness: Alert and oriented  Recent and remote memory: Good recall and able to follow commands  Attention span and concentration: Normal in conversation  Language/Speech: No aphasia or dysarthria  Fund of knowledge: Appropriate in this setting  Cranial Nerves  2nd: PERRL  3rd,4th,6th:  EOMI, no nystagmus  5th: Facial sensation intact  7th: No facial asymmetry or droop  8th: Hearing grossly intact  9th/ 10th: Palate symmetric  11th: Normal shoulder shrug  12th: Tongue midline    Data reviewed  - MRI IAC W/WO CONTRAST done today in [facility] PACS   - Official radiology report pending  - Previous charts  reviewed    Discussions with other providers: Dr. Oliver Hum     Assessment:    ICD-10-CM    1. Acoustic neuroma (CMS [facility])  D33.3 Referral to ENT - Otoloaryngology     MRI [facility] W/WO CONTRAST     Recommendations   Pharis Kruppa is in agreement with the following plan:    - Refer back to ENT (Dr. Roslyn Smiling) to re-establish care. MRI IAC w/wo contrast prior to next visit  - RTC every other year than ENT visits, sooner if problems develop.     - The patient has been advised to follow up with their PCP in regards any chronic medical conditions and any non-neurosurgical symptoms that they may have. A copy of the note from today's clinic appointment will be sent to the patient's PCP Peri Maris, DO on file (confirmed during visit)    The patient was seen as a shared visit with the co-signing faculty.    Luisa Dago, APRN,NP-C 04/13/2019, 13:56    With Dr. Oliver Hum   I personally saw and examined the patient. See the mid-level provider's note for additional details. My findings are as follows:    History:  Right AN found in 2018 during workup for hearing loss. Patient decided to follow with MRI. No new problems. Occasional imbalance.    Exam:  Awake alert motor intact.    Studies:  MRI shows no change in 10 mm right AN.    Imp:  Stable right AN.    Plan:  He wishes to continue to follow. Will set up appt for MRI and ENT visit for next year. For patient's convenience, will alternate ENT and NS appts as long as he wishes to continue to follow lesion.      Jarvis Morgan, MD 04/13/2019, 14:33

## 2019-04-13 NOTE — Telephone Encounter (Signed)
-----   Message from Roy [name] sent at 04/13/2019  2:29 PM EST -----  Pt needs rescheduled sooner with Dr. Roslyn Smiling for acoustic neuroma - lost follow up - needs to re-establish care with Dr. Roslyn Smiling please advise nothing available until February 2021.

## 2019-04-13 NOTE — Telephone Encounter (Signed)
Suzette-wife will adv Nakeem to come over right after MRI.jlm 04/13/19 @ 933am

## 2019-04-13 NOTE — Telephone Encounter (Signed)
Returned call to patient no answer left VM.

## 2019-07-08 ENCOUNTER — Encounter

## 2019-07-08 ENCOUNTER — Encounter (INDEPENDENT_AMBULATORY_CARE_PROVIDER_SITE_OTHER): Payer: Self-pay | Admitting: Otolaryngology

## 2020-01-20 IMAGING — US ABD LIMITED
1 series · 14 of 25 positions shown · non-contrast
Comparison: Ultrasound 09/03/2015 and 03/06/2015.

EXAM:  GEFFER PROFESSIONAL READ ABD U/S LMTD
INDICATION: Fatty liver.

[Series 1: abd limited · 14 of 60 slices shown]
[im 1/60]
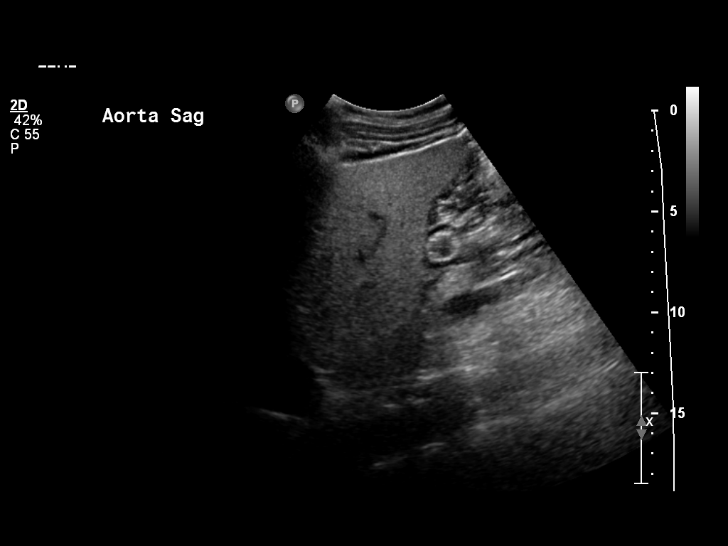
[im 5/60]
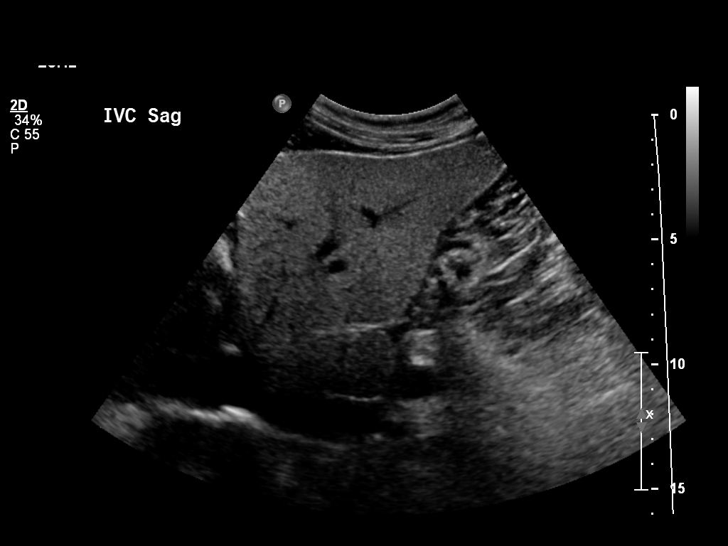
[im 10/60]
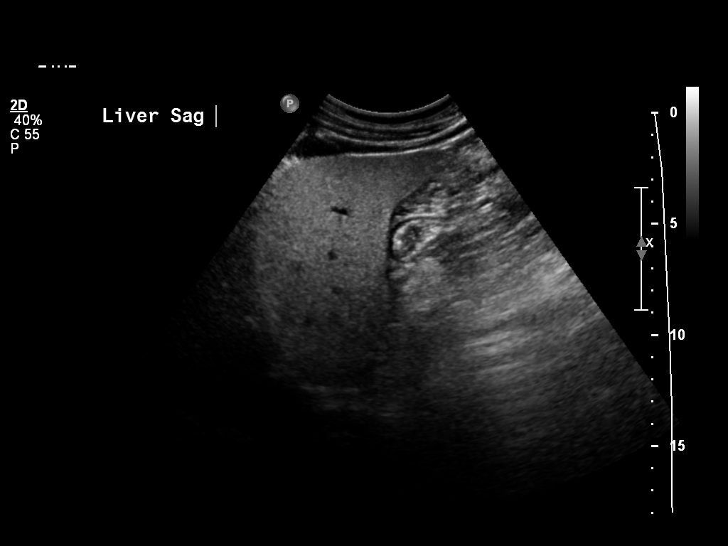
[im 15/60]
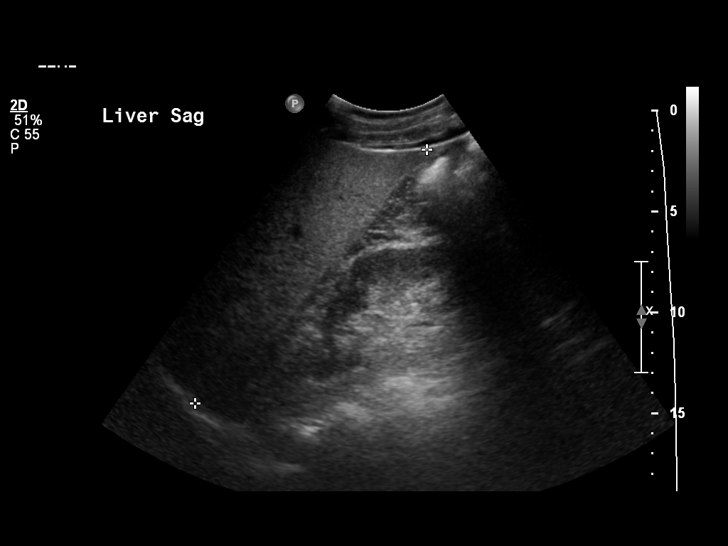
[im 20/60]
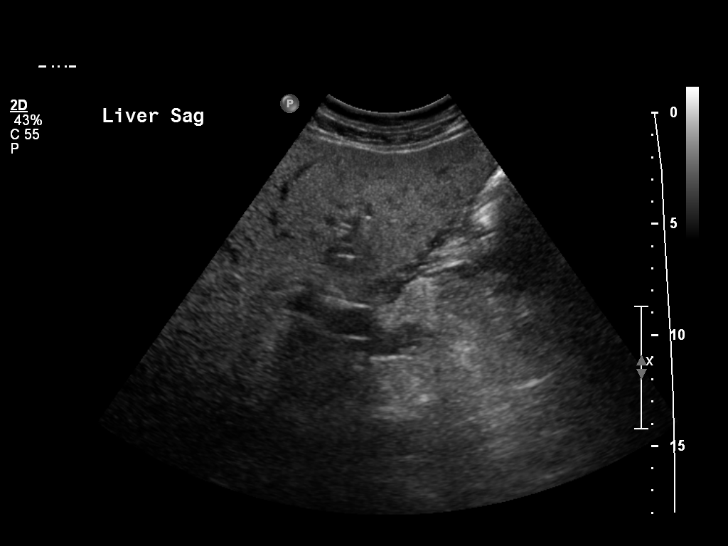
[im 23/60]
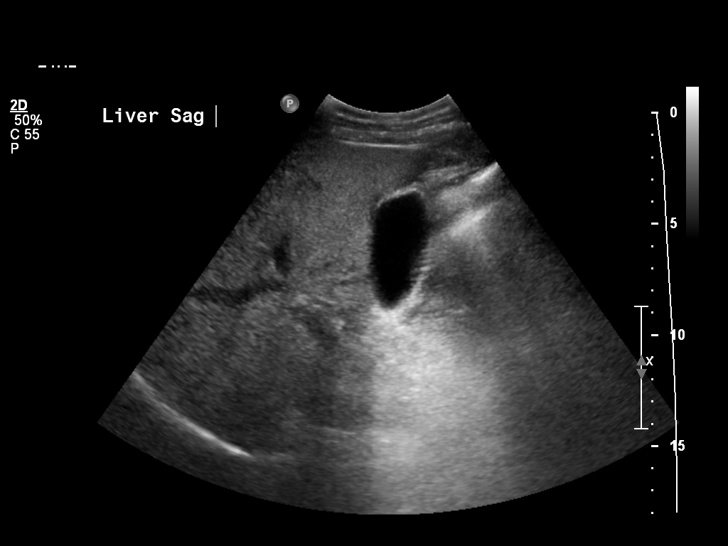
[im 28/60]
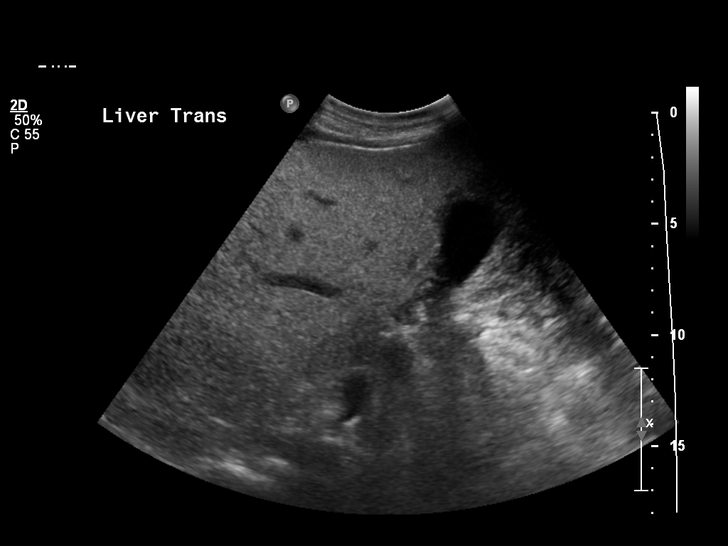
[im 32/60]
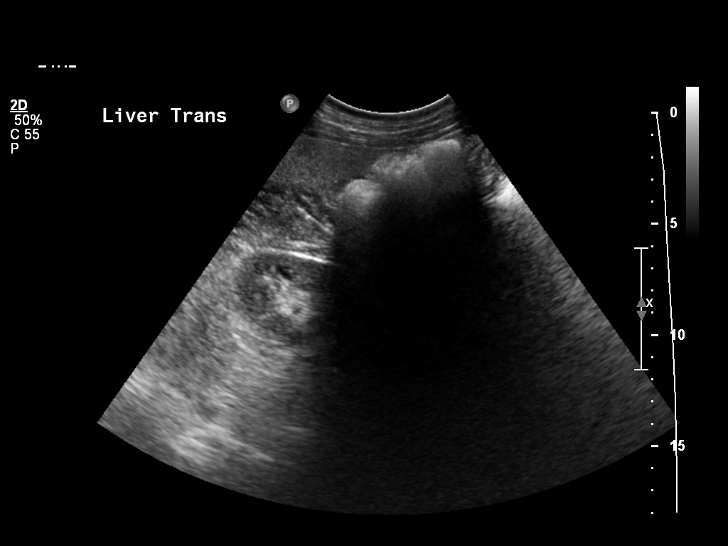
[im 37/60]
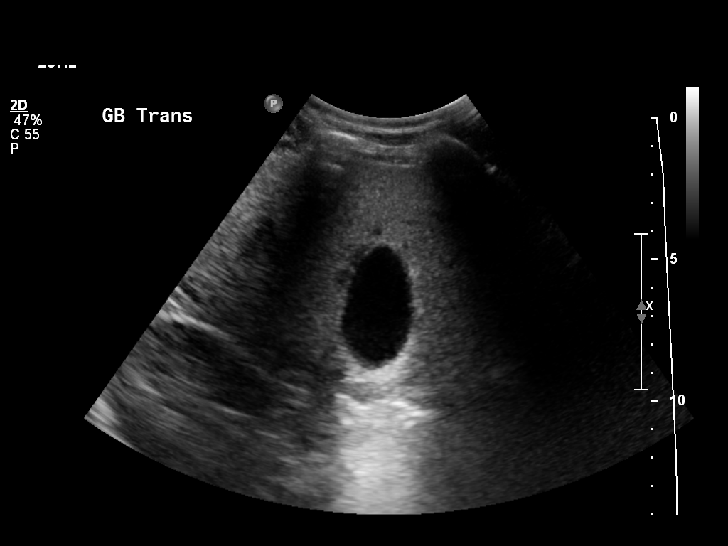
[im 40/60]
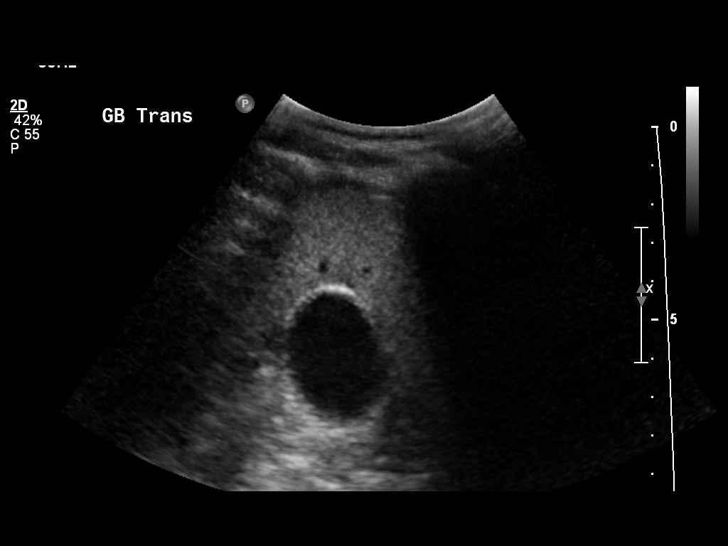
[im 45/60]
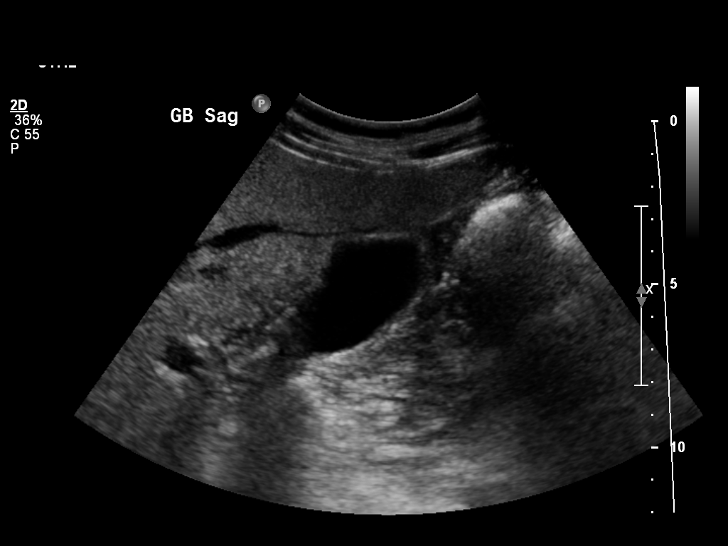
[im 50/60]
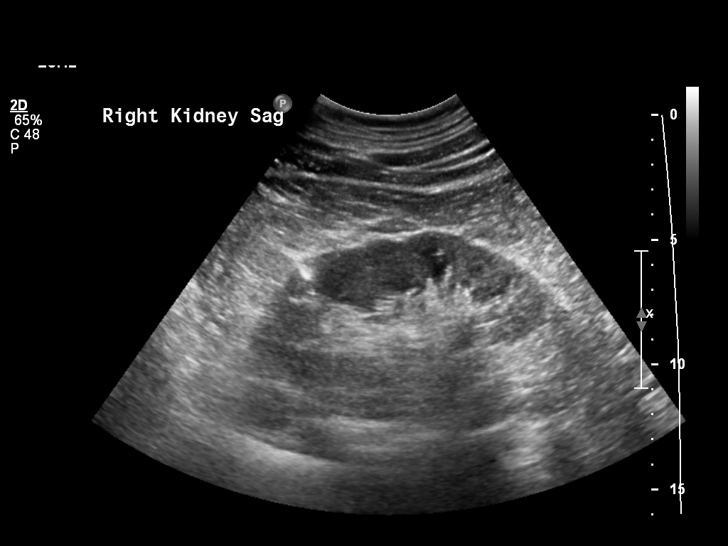
[im 55/60]
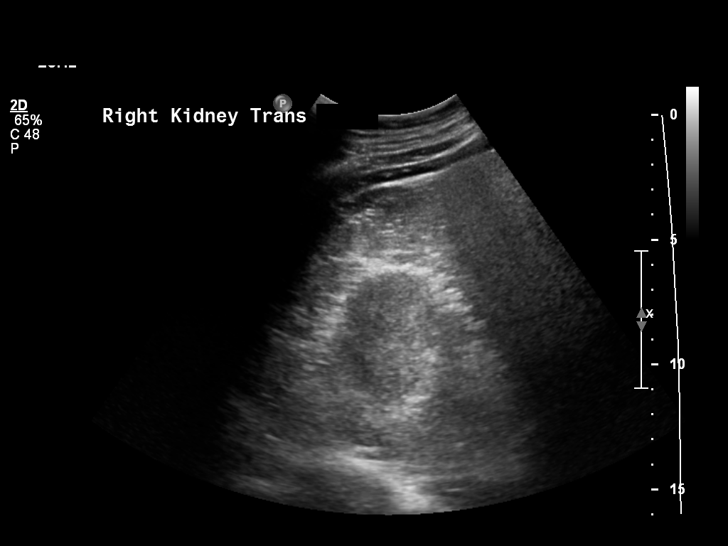
[im 60/60]
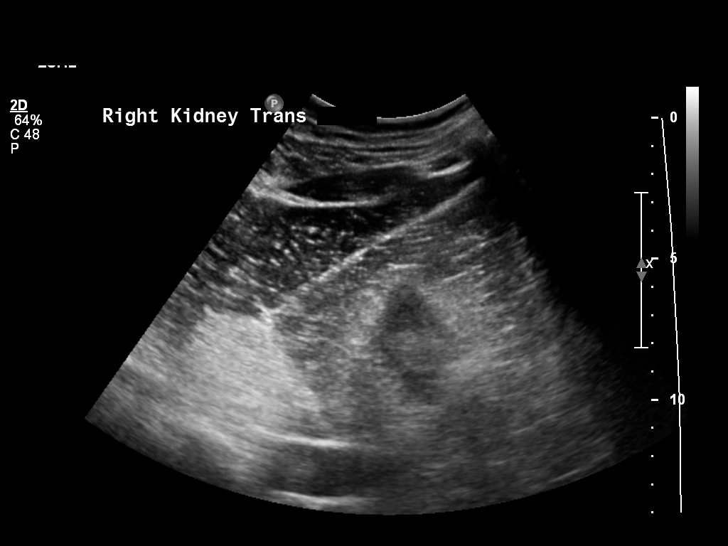

[14 of 25 positions shown; findings below may reference images not displayed]

FINDINGS: Aorta and IVC are within normal limits.  Visualized portions of the pancreas are within normal limits.  

Liver measures 17 cm in length and is mildly borderline enlarged.  There is a hypoechoic region within the right hepatic lobe measuring approximately 1.6 x 1.5 x 1.7 cm.  

The liver demonstrates increased echogenicity compatible with steatosis.  

Portal vein is within normal limits and hepatopetal measuring 1.2 cm.  Hepatic veins are patent.  

Gallbladder is within normal limits with the wall measuring 2 mm.  Common bile duct is within normal limits at 4 mm.

Right kidney measures 12.4 cm in length with normal cortical thickness and echotexture.  

No upper abdominal ascites.
IMPRESSION: Echogenic and mildly enlarged liver compatible with steatosis.

There is a hypoechoic lesion within the right hepatic lobe which measures approximately 1.6 x 1.5 x 1.7 cm which is favored to represent focal fatty sparing, however, CT or MRI is recommended to definitively characterize.

## 2020-04-11 ENCOUNTER — Ambulatory Visit
Admission: RE | Admit: 2020-04-11 | Discharge: 2020-04-11 | Disposition: A | Discharge status: Home / Self Care | Payer: Medicaid Other | Source: Ambulatory Visit | Attending: Family | Admitting: Family

## 2020-04-11 ENCOUNTER — Ambulatory Visit (HOSPITAL_BASED_OUTPATIENT_CLINIC_OR_DEPARTMENT_OTHER): Payer: Medicaid Other | Admitting: Neurological Surgery

## 2020-04-11 ENCOUNTER — Encounter (INDEPENDENT_AMBULATORY_CARE_PROVIDER_SITE_OTHER): Payer: Self-pay | Admitting: Neurological Surgery

## 2020-04-11 ENCOUNTER — Other Ambulatory Visit: Payer: Self-pay

## 2020-04-11 VITALS — BP 132/95 | HR 67 | Temp 98.6°F | Ht 71.46 in | Wt 187.4 lb

## 2020-04-11 DIAGNOSIS — D333 Benign neoplasm of cranial nerves: Secondary | ICD-10-CM

## 2020-04-11 DIAGNOSIS — Z6825 Body mass index (BMI) 25.0-25.9, adult: Secondary | ICD-10-CM

## 2020-04-11 MED ORDER — GADOBUTROL 1 MMOL/ML (604.72 MG/ML) INTRAVENOUS SOLUTION
8.0000 mL | INTRAVENOUS | Status: AC
Start: 2020-04-11 — End: 2020-04-11
  Administered 2020-04-11: 13:00:00 8 mL via INTRAVENOUS

## 2020-04-11 NOTE — Progress Notes (Signed)
[facility]  Return Outpatient Note    Date:  04/11/2020  Age:  59 y.o.  Referring Physician:   Peri Maris, DO  [address],  [place] [address]     F/U WITH MRI     Subjective:   59 year old male who has a small right acoustic neuroma (had sudden decrease in hearing in September 2018).VNGfrom outside facility wasnormal andhestill hasserviceablehearing. After consideration of his options, he preferred to f/u with serial imaging. He was last seen on 04/13/19: here for 1 year f/u with new MRI.    Over the last 3-4 months, he has had an increase in vertigo upon awakening which then exacerbates his anxiety. He has right sided hearing loss with tinnitus. The patient denies regular h/a, acute vision changes, syncope, sz, unilateral weakness/numbness, acute cognitive changes, recent falls/injuries, dysphagia, or incontinence.     PMH significant for HTN. He is self-employed: Architect. Tobacco status - none    Objective:   Vital Signs:  BP (!) 132/95    Pulse 67    Temp 37 C (98.6 F) (Thermal Scan)    Ht 1.815 m (5' 11.46")    Wt 85 kg (187 lb 6.3 oz)    SpO2 98%    BMI 25.80 kg/m       Constitutional  General appearance: Normal  Eyes: Ophthalmic exam of optic discs and posterior segments: difficulty visualizing fundi   Cardiovascular:   Carotid arteries: Normal  Musculoskeletal  Gait and Station: : Normal  Muscle strength (upper extremities): : Normal  Muscle strength (lower extremities): : Normal  Sensation: Normal  Coordination: Normal  Neurological  Orientation: Normal  Recent and remote memory: Normal  Attention span and concentration: Normal  Language: Normal  Fund of knowledge: Normal  Cranial Nerves  2nd: Normal  3rd,4th,6th: Normal  5th: Normal  7th: Normal  8th: Abnormal: decreased right  9th: Normal  11th: Normal  12th: Normal      Data reviewed  MRI IAC w/wo contrast from today was reviewed and compared to prior images showing: no change in 10 mm right  AN.    Assessment:      ICD-10-CM    1. Acoustic neuroma (CMS HCC)  D33.3 MRI IAC W/WO CONTRAST       Recommendations  -The natural history, film findings, and indications for treatment were discussed.  -Discussed continued surveillance with serial imaging vs GK vs surgery: he would prefer to continue to follow for now.  -RTC in 1 year with MRI IAC w/wo contrast, sooner if problems develop.   -He will check with his PCP about arranging a new audiogram next year.    -A copy of the note from today's clinic appointment will be sent via fax or mail to the patient's PCP and/or referring physician on file.       Sharman Crate, PA-C 04/11/2020, 14:42    The patient was seen as a shared visit with the co-signing faculty.    I personally saw and examined the patient. See the mid-level provider's note for additional details. My findings are as follows:    History:  Followed since 2019 for small right AN. Occasional vertigo and nausea on awakening. Hearing not changed much.    Exam:  Awake alert motor intact.    Studies:  MRI shows no change 10 mm right AN.    Imp:  Stable small right acoustic. Discussed following vs GK or surgery. He would like to continue following  for now.    Plan:  Return in one year with MRI IAC. Suggested he get new audiogram next year, he will try to get his PCP to arrange.      Jarvis Morgan, MD 04/11/2020, 15:51

## 2020-04-18 ENCOUNTER — Encounter (INDEPENDENT_AMBULATORY_CARE_PROVIDER_SITE_OTHER): Payer: Self-pay | Admitting: Neurological Surgery

## 2021-04-10 ENCOUNTER — Other Ambulatory Visit (INDEPENDENT_AMBULATORY_CARE_PROVIDER_SITE_OTHER): Payer: Self-pay

## 2021-04-10 ENCOUNTER — Encounter (INDEPENDENT_AMBULATORY_CARE_PROVIDER_SITE_OTHER): Payer: Self-pay | Admitting: Neurological Surgery

## 2021-09-04 ENCOUNTER — Other Ambulatory Visit (HOSPITAL_COMMUNITY): Payer: Self-pay | Admitting: Internal Medicine

## 2021-09-04 DIAGNOSIS — R079 Chest pain, unspecified: Secondary | ICD-10-CM

## 2021-09-04 DIAGNOSIS — I10 Essential (primary) hypertension: Secondary | ICD-10-CM

## 2021-09-23 ENCOUNTER — Telehealth (HOSPITAL_COMMUNITY): Payer: Self-pay | Admitting: Internal Medicine

## 2021-09-24 ENCOUNTER — Ambulatory Visit (HOSPITAL_COMMUNITY): Payer: Medicaid Other

## 2021-09-27 ENCOUNTER — Inpatient Hospital Stay
Admission: RE | Admit: 2021-09-27 | Discharge: 2021-09-27 | Disposition: A | Discharge status: Home / Self Care | Payer: BC Managed Care – PPO | Source: Ambulatory Visit | Attending: Internal Medicine | Admitting: Internal Medicine

## 2021-09-27 ENCOUNTER — Other Ambulatory Visit: Payer: Self-pay

## 2021-09-27 DIAGNOSIS — R079 Chest pain, unspecified: Secondary | ICD-10-CM | POA: Insufficient documentation

## 2021-09-27 DIAGNOSIS — E785 Hyperlipidemia, unspecified: Secondary | ICD-10-CM

## 2021-09-27 DIAGNOSIS — I10 Essential (primary) hypertension: Secondary | ICD-10-CM | POA: Insufficient documentation

## 2021-11-01 ENCOUNTER — Ambulatory Visit (INDEPENDENT_AMBULATORY_CARE_PROVIDER_SITE_OTHER): Payer: BC Managed Care – PPO | Admitting: OTOLARYNGOLOGY

## 2021-11-01 ENCOUNTER — Other Ambulatory Visit (INDEPENDENT_AMBULATORY_CARE_PROVIDER_SITE_OTHER): Payer: Self-pay | Admitting: OTOLARYNGOLOGY

## 2021-11-01 ENCOUNTER — Encounter (INDEPENDENT_AMBULATORY_CARE_PROVIDER_SITE_OTHER): Payer: Self-pay | Admitting: OTOLARYNGOLOGY

## 2021-11-01 ENCOUNTER — Other Ambulatory Visit: Payer: Self-pay

## 2021-11-01 DIAGNOSIS — Z01818 Encounter for other preprocedural examination: Secondary | ICD-10-CM

## 2021-11-01 DIAGNOSIS — Z86018 Personal history of other benign neoplasm: Secondary | ICD-10-CM

## 2021-11-01 DIAGNOSIS — C44319 Basal cell carcinoma of skin of other parts of face: Secondary | ICD-10-CM

## 2021-11-01 DIAGNOSIS — C4491 Basal cell carcinoma of skin, unspecified: Secondary | ICD-10-CM

## 2021-11-01 DIAGNOSIS — R0981 Nasal congestion: Secondary | ICD-10-CM

## 2021-11-01 DIAGNOSIS — J301 Allergic rhinitis due to pollen: Secondary | ICD-10-CM

## 2021-11-03 ENCOUNTER — Encounter (INDEPENDENT_AMBULATORY_CARE_PROVIDER_SITE_OTHER): Payer: Self-pay | Admitting: OTOLARYNGOLOGY

## 2021-11-03 NOTE — H&P (Signed)
ENT, [place]  [place] [address]  Phone: [phone]  Fax: [phone]      Encounter Date: 11/01/2021    Patient ID: Roy [name]  MRN: [id number]    DOB: Sep 07, 1960  Age: 61 y.o. male        Referring Provider:    No referring provider defined for this encounter.    Reason for Visit:   Chief Complaint   Patient presents with   . Skin Cancer     New patient here from Dr. Clayton Bibles for [facility] of left cheek        History of Present Illness:  Roy [name] is a 61 y.o. male who Presents as a referral for basal cell carcinoma left cheek. Patient was referred from Dermatology for evaluation / need for plastic reconstruction post Mohs excision. Patient also reports he has a history of a right acoustic neuroma is being monitored by Neurootology at [facility] [place]. His last MRI was 2 years ago. Denies any change in hearing / weakness or vertigo. He also reports chronic nasal congestion and sinus /allergy symptoms.      Patient History:  There is no problem list on file for this patient.    Current Outpatient Medications   Medication Sig   . APRISO 0.375 gram Oral Capsule, Sust. Release 24 hr    . budesonide (ENTOCORT EC) 3 mg Oral Capsule, Delayed & Ext.Release Take 3 Capsules (9 mg total) by mouth Every night   . clonazePAM (KLONOPIN) 1 mg Oral Tablet Take 1 Tablet (1 mg total) by mouth Twice per day as needed   . glucosam-chon-msm1-C-mang (OSTEO BI-FLEX TRIPLE STRENGTH) 750 mg-644 mg- 30 mg-1 mg Oral Tablet Take 2 Tablets by mouth Once a day   . Ibuprofen (MOTRIN) 200 mg Oral Tablet Take 1 Tablet (200 mg total) by mouth Four times a day as needed for Pain   . KRILL OIL ORAL Take 1 Capsule by mouth Once a day   . Omeprazole Magnesium 20 mg Oral Tablet, Delayed Release (E.C.) Take 1 Tablet (20 mg total) by mouth Every one hour   . RED YEAST RICE ORAL Take 2 Tablets by mouth Once a day   . TUMERIC-GING-OLIVE-OREG-CAPRYL ORAL Take 1 Capsule by mouth Once a day     No Known  Allergies  Past Medical History:   Diagnosis Date   . Crohn's disease (CMS [facility])    . Esophageal reflux    . Fatty liver     Per liver ultrasounds   . Panic disorder    . 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 HX NO SURGICAL PROCEDURES        Family Medical History:     Problem Relation (Age of Onset)    Pancreatitis Maternal Grandmother          Social History     Tobacco Use   . Smoking status: Former     Packs/day: 1.00     Years: 10.00     Pack years: 10.00     Types: Cigarettes     Quit date: 02/10/1993     Years since quitting: 28.7   . Smokeless tobacco: Never   Substance Use Topics   . Alcohol use: Not Currently     Comment: Prior alcohol usage (6 pack beer daily in mid-late 1990's); Quit date year 2000   . Drug use: Not  Currently       Review of Systems:  Review of Systems   HENT: Positive for congestion.        Physical Exam:  There were no vitals taken for this visit.      Physical Exam  Constitutional:       Appearance: Normal appearance. He is well-developed, well-groomed and normal weight.   HENT:      Head: Normocephalic and atraumatic.      Comments: Left cheek biopsy site noted     Right Ear: Hearing, tympanic membrane, ear canal and external ear normal.      Left Ear: Hearing, tympanic membrane, ear canal and external ear normal.      Nose: Septal deviation and mucosal edema present.      Right Turbinates: Enlarged.      Left Turbinates: Enlarged.      Mouth/Throat:      Lips: Pink.      Mouth: Mucous membranes are moist.      Pharynx: Oropharynx is clear. Uvula midline.   Eyes:      Extraocular Movements: Extraocular movements intact.   Neck:      Trachea: Phonation normal.   Pulmonary:      Effort: Pulmonary effort is normal.   Musculoskeletal:      Cervical back: Normal range of motion and neck supple.   Lymphadenopathy:      Cervical: No cervical adenopathy.   Skin:     General: Skin is warm.   Neurological:      Mental Status: He is alert  and oriented to person, place, and time.      Cranial Nerves: Cranial nerves 2-12 are intact. No facial asymmetry.   Psychiatric:         Attention and Perception: Attention normal.         Mood and Affect: Mood normal.         Speech: Speech normal.         Behavior: Behavior normal. Behavior is cooperative.         Assessment:  ENCOUNTER DIAGNOSES     ICD-10-CM   1. Basal cell carcinoma (BCC)  C44.91   2. History of acoustic neuroma  Z86.018   3. Chronic nasal congestion  R09.81   4. Non-seasonal allergic rhinitis due to pollen  J30.1       Plan:  Medical records reviewed on 11/01/2021.  Pathology reviewed showing right cheek BCCA   Reconstructive surgery is indicated post Mohs excision. Reconstructive options including flap closure possible skin graft discussed including the risks, benefits, alternatives. All questions answered. Patient wishes to proceed.   Refer for medical clearance/ risk stratification   Nasal saline, rx Nasacort  Order preoperative labs  Orders Placed This Encounter   . 52778 - NASAL ENDOSCOPY DIAGNOSTIC UNILATERAL OR BILATERAL (AMB ONLY)     Return for Evaluation post Mohs excision done by dermatologist.    [name], DO

## 2021-11-03 NOTE — Procedures (Signed)
ENT, [place]  [place] [address]    Procedure Note    Name: Roy [name] MRN:  [id number]   Date: 11/01/2021 Age: 61 y.o.       31231 - NASAL ENDOSCOPY DIAGNOSTIC UNILATERAL OR BILATERAL (AMB ONLY)  Performed by: [name], DO  Authorized by: [name], DO         Indications for procedure: Obstructive nasal breathing and Monitor chronic disease    Anesthesia: Oxymetazoline nasal spray    Description: Nasal endoscopy with rigid scope was performed with examination of the  septum, inferior, middle, and superior meatus, turbinates, sphenoethmoidal recess, and nasopharynx.     There were no polyps, pus, or granulation tissue noted.  ET orifices and nasopharynx were normal.     Findings: Septal deviation, Inferior turbinate hypertrophy and Allergic rhinitis    The patient tolerated the procedure well.          [name], DO

## 2021-11-13 ENCOUNTER — Other Ambulatory Visit: Payer: Self-pay

## 2021-11-13 ENCOUNTER — Encounter (INDEPENDENT_AMBULATORY_CARE_PROVIDER_SITE_OTHER): Payer: Self-pay | Admitting: Family

## 2021-11-13 ENCOUNTER — Ambulatory Visit (INDEPENDENT_AMBULATORY_CARE_PROVIDER_SITE_OTHER): Payer: BC Managed Care – PPO | Admitting: Family

## 2021-11-13 VITALS — BP 153/77 | HR 64 | Resp 17 | Ht 70.5 in | Wt 191.4 lb

## 2021-11-13 DIAGNOSIS — N400 Enlarged prostate without lower urinary tract symptoms: Secondary | ICD-10-CM

## 2021-11-13 LAB — POCT PVR

## 2021-11-13 NOTE — Progress Notes (Signed)
UROLOGY, [facility]  [address]  [place] [address]      Return Patient Note     Name: Roy [name] MRN:  [id number]   Date: 11/13/2021 Age/DOB:60 y.o. [birth year]        Name: Roy [name]                       Date of Birth: 1960/08/28   MRN:  [id number]                         Date of visit: 11/13/2021     PCP: Roy Maris, DO   Referring Provider: No referring provider defined for this encounter.     HPI:  Roy [name] is a 61 y.o. male who presents with Benign Prostatic Hypertrophy (6 month follow up, no issues with urination.)   to clinic.      CT pelvis without contrast 04/22/20 showed prostate concretions. He has Hx of urinary dribbling but is not bothered by this currently.    PSA in 2019 was 1.3 ng/mL  PSA 05/21/20 was 1.45 ng/mL  PSA 05/09/21 was 1.39 ng/mL    He underwent I and D of scrotal abscess 02/18/21 with Dr. Despina Pole.     Past Medical History  Current Outpatient Medications   Medication Sig   . APRISO 0.375 gram Oral Capsule, Sust. Release 24 hr    . budesonide (ENTOCORT EC) 3 mg Oral Capsule, Delayed & Ext.Release Take 3 Capsules (9 mg total) by mouth Every night   . clonazePAM (KLONOPIN) 1 mg Oral Tablet Take 1 Tablet (1 mg total) by mouth Twice per day as needed   . glucosam-chon-msm1-C-mang (OSTEO BI-FLEX TRIPLE STRENGTH) 750 mg-644 mg- 30 mg-1 mg Oral Tablet Take 2 Tablets by mouth Once a day   . Ibuprofen (MOTRIN) 200 mg Oral Tablet Take 1 Tablet (200 mg total) by mouth Four times a day as needed for Pain   . KRILL OIL ORAL Take 1 Capsule by mouth Once a day   . Omeprazole Magnesium 20 mg Oral Tablet, Delayed Release (E.C.) Take 1 Tablet (20 mg total) by mouth Every one hour   . RED YEAST RICE ORAL Take 2 Tablets by mouth Once a day   . TUMERIC-GING-OLIVE-OREG-CAPRYL ORAL Take 1 Capsule by mouth Once a day     No Known Allergies  Past Medical History:   Diagnosis Date   . Crohn's disease (CMS [place] [place])    . Esophageal reflux    . Fatty liver     Per liver ultrasounds   .  Panic disorder    . Right acoustic neuroma (CMS [facility])          Past Surgical History:   Procedure Laterality Date   . HX NO SURGICAL PROCEDURES           Family Medical History:     Problem Relation (Age of Onset)    Pancreatitis Maternal Grandmother          Social History     Socioeconomic History   . Marital status: Married   . Number of children: 0   Occupational History   . 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 Smoking status: Former     Packs/day: 1.00     Years: 10.00     Pack years: 10.00     Types: Cigarettes     Quit date:  02/10/1993     Years since quitting: 28.7   . Smokeless tobacco: Never   Substance and Sexual Activity   . Alcohol use: Not Currently     Comment: Prior alcohol usage (6 pack beer daily in mid-late 1990's); Quit date year 2000   . Drug use: Not Currently   Other Topics Concern   . Right hand dominant Yes     Social Determinants of Health     Financial Resource Strain: Low Risk    . SDOH Financial: No   Transportation Needs: Low Risk    . SDOH Transportation: No   Social Connections: Low Risk    . SDOH Social Isolation: 5 or more times a week   Intimate Partner Violence: Low Risk    . SDOH Domestic Violence: No   Housing Stability: Low Risk    . SDOH Housing Situation: I have housing.   Marland Kitchen SDOH Housing Worry: No        Patient Active Problem List    Diagnosis Date Noted   . Benign prostatic hyperplasia 11/13/2021        REVIEW OF SYSTEMS:   As per HPI.      Physical Exam  Constitutional:       General: He is not in acute distress.     Appearance: He is not ill-appearing.   Pulmonary:      Effort: Pulmonary effort is normal. No respiratory distress.   Neurological:      Mental Status: He is alert.   Psychiatric:         Mood and Affect: Mood normal.         Behavior: Behavior normal.       BP (!) 153/77   Pulse 64   Resp 17   Ht 1.791 m (5' 10.5")   Wt 86.8 kg (191 lb 6.4 oz)   SpO2 99%   BMI 27.07 kg/m       Assessment/Plan  Assessment/Plan   1.  Benign prostatic hyperplasia, unspecified whether lower urinary tract symptoms present      Plan- He isn't bothered by dribbling currently. He also denies scotral symptoms at this visit. He will follow-up in 6 months for prostate cancer screening.    Atoya Andrew, FNP-C

## 2021-11-13 NOTE — Progress Notes (Signed)
61 yo male patient.    PCP: Dr. Ferdinand Lango    CT pelvis without contrast 04/22/20 showed prostate concretions.    He has urinary dribbling but is not overly bothered by this and defers treatment currently.    PSA in 2019 was 1.3 ng/mL  DRE done 05/21/20.  PSA 05/21/20 was 1.45 ng/mL    05/21/20 urine culture showed no growth.    06/19/20 UA was negative.  His 06/2020 urine cytology was negative.    He underwent I and D of scrotal abscess 02/18/21 with Dr. Despina Pole. He presented today requesting his op site to be checked.    02/26/2021:  Roy [name] is a pleasant 15yoM with PmHx of BPH and a recent diagnosis of scrotal abscess requiring I&D on 9/19. His wife has been packing the incision and he is on cefuroxime for a total of 14 days. He denies bleeding, oozing, discharge, fevers, chills, nausea, vomiting, changes in appetite.    05/09/21: No scrotal c/o. He was on antibiotic per PCP for "urethra burning" which is now resolved.

## 2021-11-14 ENCOUNTER — Encounter (INDEPENDENT_AMBULATORY_CARE_PROVIDER_SITE_OTHER): Payer: Self-pay | Admitting: Family

## 2021-11-27 ENCOUNTER — Encounter (INDEPENDENT_AMBULATORY_CARE_PROVIDER_SITE_OTHER): Payer: Self-pay | Admitting: OTOLARYNGOLOGY

## 2021-12-11 ENCOUNTER — Other Ambulatory Visit: Payer: BC Managed Care – PPO | Attending: OTOLARYNGOLOGY

## 2021-12-11 ENCOUNTER — Other Ambulatory Visit: Payer: Self-pay

## 2021-12-11 DIAGNOSIS — C44319 Basal cell carcinoma of skin of other parts of face: Secondary | ICD-10-CM | POA: Insufficient documentation

## 2021-12-11 DIAGNOSIS — Z01818 Encounter for other preprocedural examination: Secondary | ICD-10-CM | POA: Insufficient documentation

## 2021-12-11 LAB — BASIC METABOLIC PANEL
ANION GAP: 7 mmol/L — ABNORMAL LOW (ref 10–20)
BUN/CREA RATIO: 11 (ref 6–22)
BUN: 12 mg/dL (ref 7–25)
CALCIUM: 9.6 mg/dL (ref 8.6–10.3)
CHLORIDE: 103 mmol/L (ref 98–107)
CO2 TOTAL: 28 mmol/L (ref 21–31)
CREATININE: 1.09 mg/dL (ref 0.60–1.30)
ESTIMATED GFR: 78 mL/min/{1.73_m2} (ref >59)
GLUCOSE: 105 mg/dL (ref 74–109)
OSMOLALITY, CALCULATED: 276 mOsm/kg (ref 270–290)
POTASSIUM: 4.3 mmol/L (ref 3.5–5.1)
SODIUM: 138 mmol/L (ref 136–145)

## 2021-12-11 LAB — CBC WITH DIFF
BASOPHIL #: 0.2 10*3/uL (ref 0.00–0.30)
BASOPHIL %: 2 % (ref 0–3)
EOSINOPHIL #: 0.2 10*3/uL (ref 0.00–0.80)
EOSINOPHIL %: 3 % (ref 0–7)
HCT: 49.6 % (ref 42.0–51.0)
HGB: 16.8 g/dL (ref 13.5–18.0)
LYMPHOCYTE #: 1.8 10*3/uL (ref 1.10–5.00)
LYMPHOCYTE %: 24 % — ABNORMAL LOW (ref 25–45)
MCH: 28.3 pg (ref 27.0–32.0)
MCHC: 33.8 g/dL (ref 32.0–36.0)
MCV: 83.5 fL (ref 78.0–99.0)
MONOCYTE #: 0.4 10*3/uL (ref 0.00–1.30)
MONOCYTE %: 5 % (ref 0–12)
MPV: 7.6 fL (ref 7.4–10.4)
NEUTROPHIL #: 5 10*3/uL (ref 1.80–8.40)
NEUTROPHIL %: 66 % (ref 40–76)
PLATELETS: 293 10*3/uL (ref 140–440)
RBC: 5.94 10*6/uL (ref 4.20–6.00)
RDW: 13.9 % (ref 11.6–14.8)
WBC: 7.6 10*3/uL (ref 4.0–10.5)
WBCS UNCORRECTED: 7.6 10*3/uL

## 2021-12-11 LAB — PT/INR
INR: 1.02 (ref <=5.00)
PROTHROMBIN TIME: 11.8 seconds (ref 9.8–12.7)

## 2021-12-11 LAB — PTT (PARTIAL THROMBOPLASTIN TIME): APTT: 31.2 seconds (ref 26.0–36.0)

## 2021-12-25 ENCOUNTER — Other Ambulatory Visit: Payer: Self-pay

## 2021-12-25 ENCOUNTER — Encounter (INDEPENDENT_AMBULATORY_CARE_PROVIDER_SITE_OTHER): Payer: Self-pay | Admitting: OTOLARYNGOLOGY

## 2021-12-25 ENCOUNTER — Ambulatory Visit (INDEPENDENT_AMBULATORY_CARE_PROVIDER_SITE_OTHER): Payer: BC Managed Care – PPO | Admitting: OTOLARYNGOLOGY

## 2021-12-25 VITALS — Wt 190.0 lb

## 2021-12-25 DIAGNOSIS — C44319 Basal cell carcinoma of skin of other parts of face: Secondary | ICD-10-CM

## 2021-12-25 MED ORDER — CEFDINIR 300 MG CAPSULE
300.0000 mg | ORAL_CAPSULE | Freq: Two times a day (BID) | ORAL | 0 refills | Status: DC
Start: 2021-12-25 — End: 2022-01-09

## 2021-12-25 MED ORDER — MUPIROCIN 2 % TOPICAL OINTMENT
TOPICAL_OINTMENT | Freq: Three times a day (TID) | CUTANEOUS | 5 refills | Status: DC
Start: 2021-12-25 — End: 2021-12-25

## 2021-12-25 MED ORDER — CEFDINIR 300 MG CAPSULE
300.0000 mg | ORAL_CAPSULE | Freq: Two times a day (BID) | ORAL | 0 refills | Status: DC
Start: 2021-12-25 — End: 2021-12-25

## 2021-12-25 MED ORDER — MUPIROCIN 2 % TOPICAL OINTMENT
TOPICAL_OINTMENT | Freq: Three times a day (TID) | CUTANEOUS | 5 refills | Status: DC
Start: 2021-12-25 — End: 2023-10-15

## 2021-12-26 ENCOUNTER — Encounter (HOSPITAL_COMMUNITY): Payer: Self-pay | Admitting: OTOLARYNGOLOGY

## 2021-12-26 ENCOUNTER — Inpatient Hospital Stay
Admission: RE | Admit: 2021-12-26 | Discharge: 2021-12-26 | Disposition: A | Discharge status: Home / Self Care | Payer: BC Managed Care – PPO | Source: Ambulatory Visit | Attending: OTOLARYNGOLOGY | Admitting: OTOLARYNGOLOGY

## 2021-12-26 ENCOUNTER — Ambulatory Visit (HOSPITAL_COMMUNITY): Payer: BC Managed Care – PPO | Admitting: Anesthesiology

## 2021-12-26 ENCOUNTER — Encounter (HOSPITAL_COMMUNITY)
Admission: RE | Disposition: A | Discharge status: Home / Self Care | Payer: Self-pay | Source: Ambulatory Visit | Attending: OTOLARYNGOLOGY

## 2021-12-26 DIAGNOSIS — C44310 Basal cell carcinoma of skin of unspecified parts of face: Secondary | ICD-10-CM | POA: Diagnosis present

## 2021-12-26 DIAGNOSIS — C44319 Basal cell carcinoma of skin of other parts of face: Secondary | ICD-10-CM

## 2021-12-26 DIAGNOSIS — Z428 Encounter for other plastic and reconstructive surgery following medical procedure or healed injury: Secondary | ICD-10-CM

## 2021-12-26 SURGERY — PLACEMENT FLAP SCALP
Anesthesia: General | Site: Face | Laterality: Left | Wound class: Clean Wound: Uninfected operative wounds in which no inflammation occurred

## 2021-12-26 MED ORDER — FENTANYL (PF) 50 MCG/ML INJECTION WRAPPER
INJECTION | Freq: Once | INTRAMUSCULAR | Status: DC | PRN
Start: 2021-12-26 — End: 2021-12-26
  Administered 2021-12-26 (×2): 50 ug via INTRAVENOUS

## 2021-12-26 MED ORDER — LIDOCAINE 1 %-EPINEPHRINE 1:100,000 INJECTION SOLUTION
INTRAMUSCULAR | Status: AC
Start: 2021-12-26 — End: 2021-12-26
  Filled 2021-12-26: qty 50

## 2021-12-26 MED ORDER — LACTATED RINGERS INTRAVENOUS SOLUTION
INTRAVENOUS | Status: DC
Start: 2021-12-26 — End: 2021-12-26

## 2021-12-26 MED ORDER — FAMOTIDINE (PF) 20 MG/2 ML INTRAVENOUS SOLUTION
20.0000 mg | Freq: Once | INTRAVENOUS | Status: AC
Start: 2021-12-26 — End: 2021-12-26
  Administered 2021-12-26: 20 mg via INTRAVENOUS

## 2021-12-26 MED ORDER — DEXAMETHASONE SODIUM PHOSPHATE 4 MG/ML INJECTION SOLUTION
4.0000 mg | Freq: Once | INTRAMUSCULAR | Status: AC
Start: 2021-12-26 — End: 2021-12-26
  Administered 2021-12-26: 4 mg via INTRAVENOUS

## 2021-12-26 MED ORDER — LIDOCAINE-EPINEPHRINE (PF) 1 %-1:100,000 INJECTION SOLUTION
Freq: Once | INTRAMUSCULAR | Status: DC | PRN
Start: 2021-12-26 — End: 2021-12-26
  Administered 2021-12-26: 2 mL via INTRADERMAL

## 2021-12-26 MED ORDER — SODIUM CHLORIDE 0.9 % INTRAVENOUS PIGGYBACK
INJECTION | INTRAVENOUS | Status: AC
Start: 2021-12-26 — End: 2021-12-26
  Filled 2021-12-26: qty 100

## 2021-12-26 MED ORDER — SODIUM CHLORIDE 0.9 % (FLUSH) INJECTION SYRINGE
3.0000 mL | INJECTION | Freq: Three times a day (TID) | INTRAMUSCULAR | Status: DC
Start: 2021-12-26 — End: 2021-12-26

## 2021-12-26 MED ORDER — SODIUM CHLORIDE 0.9 % (FLUSH) INJECTION SYRINGE
3.0000 mL | INJECTION | INTRAMUSCULAR | Status: DC | PRN
Start: 2021-12-26 — End: 2021-12-26

## 2021-12-26 MED ORDER — PROPOFOL 10 MG/ML IV BOLUS
INJECTION | Freq: Once | INTRAVENOUS | Status: DC | PRN
Start: 2021-12-26 — End: 2021-12-26
  Administered 2021-12-26: 160 mg via INTRAVENOUS

## 2021-12-26 MED ORDER — ALBUTEROL SULFATE 2.5 MG/3 ML (0.083 %) SOLUTION FOR NEBULIZATION
2.5000 mg | INHALATION_SOLUTION | Freq: Once | RESPIRATORY_TRACT | Status: DC | PRN
Start: 2021-12-26 — End: 2021-12-26

## 2021-12-26 MED ORDER — MUPIROCIN 2 % TOPICAL OINTMENT
TOPICAL_OINTMENT | Freq: Once | CUTANEOUS | Status: DC | PRN
Start: 2021-12-26 — End: 2021-12-26
  Administered 2021-12-26: 2 via TOPICAL

## 2021-12-26 MED ORDER — MORPHINE 2 MG/ML INJECTION WRAPPER
2.0000 mg | INJECTION | Freq: Once | INTRAMUSCULAR | Status: DC | PRN
Start: 2021-12-26 — End: 2021-12-26

## 2021-12-26 MED ORDER — ONDANSETRON HCL (PF) 4 MG/2 ML INJECTION SOLUTION
4.0000 mg | Freq: Once | INTRAMUSCULAR | Status: AC
Start: 2021-12-26 — End: 2021-12-26
  Administered 2021-12-26: 4 mg via INTRAVENOUS

## 2021-12-26 MED ORDER — FENTANYL (PF) 50 MCG/ML INJECTION WRAPPER
50.0000 ug | INJECTION | INTRAMUSCULAR | Status: DC | PRN
Start: 2021-12-26 — End: 2021-12-26

## 2021-12-26 MED ORDER — SUGAMMADEX 100 MG/ML INTRAVENOUS SOLUTION
Freq: Once | INTRAVENOUS | Status: DC | PRN
Start: 2021-12-26 — End: 2021-12-26
  Administered 2021-12-26: 320 mg via INTRAVENOUS

## 2021-12-26 MED ORDER — ONDANSETRON HCL (PF) 4 MG/2 ML INJECTION SOLUTION
4.0000 mg | Freq: Once | INTRAMUSCULAR | Status: DC | PRN
Start: 2021-12-26 — End: 2021-12-26

## 2021-12-26 MED ORDER — DEXAMETHASONE SODIUM PHOSPHATE 4 MG/ML INJECTION SOLUTION
INTRAMUSCULAR | Status: AC
Start: 2021-12-26 — End: 2021-12-26
  Filled 2021-12-26: qty 1

## 2021-12-26 MED ORDER — MIDAZOLAM 5 MG/ML INJECTION WRAPPER
INTRAMUSCULAR | Status: AC
Start: 2021-12-26 — End: 2021-12-26
  Filled 2021-12-26: qty 1

## 2021-12-26 MED ORDER — FENTANYL (PF) 50 MCG/ML INJECTION WRAPPER
25.0000 ug | INJECTION | INTRAMUSCULAR | Status: DC | PRN
Start: 2021-12-26 — End: 2021-12-26

## 2021-12-26 MED ORDER — CEFAZOLIN 1 GRAM SOLUTION FOR INJECTION
INTRAMUSCULAR | Status: AC
Start: 2021-12-26 — End: 2021-12-26
  Filled 2021-12-26: qty 20

## 2021-12-26 MED ORDER — ONDANSETRON HCL (PF) 4 MG/2 ML INJECTION SOLUTION
INTRAMUSCULAR | Status: AC
Start: 2021-12-26 — End: 2021-12-26
  Filled 2021-12-26: qty 2

## 2021-12-26 MED ORDER — PROCHLORPERAZINE EDISYLATE 10 MG/2 ML (5 MG/ML) INJECTION SOLUTION
5.0000 mg | Freq: Once | INTRAMUSCULAR | Status: DC | PRN
Start: 2021-12-26 — End: 2021-12-26

## 2021-12-26 MED ORDER — ROCURONIUM 10 MG/ML INTRAVENOUS SOLUTION
Freq: Once | INTRAVENOUS | Status: DC | PRN
Start: 2021-12-26 — End: 2021-12-26
  Administered 2021-12-26: 40 mg via INTRAVENOUS

## 2021-12-26 MED ORDER — HYDROCODONE 7.5 MG-ACETAMINOPHEN 325 MG/15 ML ORAL SOLUTION
5.0000 mg | Freq: Four times a day (QID) | ORAL | Status: DC | PRN
Start: 2021-12-26 — End: 2021-12-26
  Administered 2021-12-26: 5 mg via ORAL
  Filled 2021-12-26: qty 15

## 2021-12-26 MED ORDER — SODIUM CHLORIDE 0.9 % INTRAVENOUS PIGGYBACK
2.0000 g | Freq: Once | INTRAVENOUS | Status: AC
Start: 2021-12-26 — End: 2021-12-26
  Administered 2021-12-26: 2 g via INTRAVENOUS

## 2021-12-26 MED ORDER — FAMOTIDINE (PF) 20 MG/2 ML INTRAVENOUS SOLUTION
INTRAVENOUS | Status: AC
Start: 2021-12-26 — End: 2021-12-26
  Filled 2021-12-26: qty 2

## 2021-12-26 MED ORDER — MIDAZOLAM 5 MG/ML INJECTION WRAPPER
3.0000 mg | Freq: Once | INTRAMUSCULAR | Status: DC | PRN
Start: 2021-12-26 — End: 2021-12-26
  Administered 2021-12-26: 3 mg via INTRAVENOUS

## 2021-12-26 MED ORDER — ONDANSETRON HCL (PF) 4 MG/2 ML INJECTION SOLUTION
4.0000 mg | INTRAMUSCULAR | Status: DC | PRN
Start: 2021-12-26 — End: 2021-12-26

## 2021-12-26 MED ORDER — FENTANYL (PF) 50 MCG/ML INJECTION SOLUTION
INTRAMUSCULAR | Status: AC
Start: 2021-12-26 — End: 2021-12-26
  Filled 2021-12-26: qty 2

## 2021-12-26 MED ORDER — LIDOCAINE (PF) 100 MG/5 ML (2 %) INTRAVENOUS SYRINGE
INJECTION | Freq: Once | INTRAVENOUS | Status: DC | PRN
Start: 2021-12-26 — End: 2021-12-26
  Administered 2021-12-26: 80 mg via INTRAVENOUS

## 2021-12-26 MED ORDER — IPRATROPIUM 0.5 MG-ALBUTEROL 3 MG (2.5 MG BASE)/3 ML NEBULIZATION SOLN
3.0000 mL | INHALATION_SOLUTION | Freq: Once | RESPIRATORY_TRACT | Status: DC | PRN
Start: 2021-12-26 — End: 2021-12-26

## 2021-12-26 MED ORDER — MUPIROCIN 2 % TOPICAL OINTMENT
TOPICAL_OINTMENT | CUTANEOUS | Status: AC
Start: 2021-12-26 — End: 2021-12-26
  Filled 2021-12-26: qty 44

## 2021-12-26 SURGICAL SUPPLY — 43 items
BLADE 15 2 END CBNSTL SURG STRL DISP (CUTTING ELEMENTS) ×2
BLADE 15 2 END CBNSTL SURG STRL DISP (SURGICAL CUTTING SUPPLIES) ×2 IMPLANT
CLEANER INSTR PREPZYME MUL-TRD CONTAINR NARSL NEUT PH BDGR (MISCELLANEOUS PT CARE ITEMS) ×2
CONV USE 23866 - NEEDLE HYPO 27GA 1.5IN STD MONOJECT SS POLYPROP REG BVL LL HUB UL SHRP ANTICORE YW STRL LF  DISP (MED SURG SUPPLIES) ×1 IMPLANT
CONV USE 31829 - NEEDLE HYPO  18GA 1.5IN STD REG BVL LF (MED SURG SUPPLIES) ×1 IMPLANT
CONV USE ITEM 321852 - GLOVE SURG 6.5 LF  PLISPRN (GLOVES AND ACCESSORIES) ×1 IMPLANT
CONV USE ITEM 321854 - GLOVE SURG 6 LF  BEAD CUF SMOOTH HI GRIP WHT 12IN MDCHC PLISPRN (GLOVES AND ACCESSORIES) ×1 IMPLANT
CONV USE ITEM 329146 - CLEANER INSTR PREPZYME MUL-TRD CONTAINR NARSL NEUT PH BDGR 22OZ (MISCELLANEOUS PT CARE ITEMS) ×1 IMPLANT
CORD BIPOLAR 12FT CAUT STD FORCEPS STRL LF (ENDOSCOPIC SUPPLIES) ×1 IMPLANT
CORD BIPOLAR 12FT CAUT STD FORCEPS STRL LF (INSTRUMENTS ENDOMECHANICAL) ×1
COUNTER 20 CNT BLOCK ADH NEEDLE STRL LF  RD SHARP FOAM 15.75X11.5X14IN DISP (MED SURG SUPPLIES) ×1 IMPLANT
COUNTER 20 CNT BLOCK ADH NEEDLE STRL LF RD SHARP FOAM 15.75 (MED SURG SUPPLIES) ×2
GLOVE SURG 6 LF PF SMOOTH STRL WHT PLISPRN (GLOVES AND ACCESSORIES) ×2
GLOVE SURG 6.5 LF PF SMOOTH STRL WHT PLISPRN (GLOVES AND ACCESSORIES) ×2
GLOVE SURG 7 LF  PF STRL PLISPRN DISP (GLOVES AND ACCESSORIES) ×1 IMPLANT
GLOVE SURG 7 LF PF SMOOTH STRL WHT PLISPRN (GLOVES AND ACCESSORIES) ×2
GOWN SURG XL STD LGTH L3 HKLP CLSR RGLN SLEEVE TWL STRL LF (DRAPE/PACKS/SHEETS/OR TOWEL) ×2
GOWN SURG XL STD LGTH L3 HKLP CLSR RGLN SLEEVE TWL STRL LF  DISP GRN AERO BLU PRFRM FBRC (DRAPE/PACKS/SHEETS/OR TOWEL) ×1 IMPLANT
HDPE THK22 UM C40-45 GL L48 IN X W40 IN NATURAL (MISCELLANEOUS PT CARE ITEMS) ×2 IMPLANT
LINER SUCT MEDIVAC CRD TW LOCK LID SHTOF VALVE CAN PORT 3L LF  DISP (MED SURG SUPPLIES) ×1 IMPLANT
LINER SUCT MEDIVAC CRD TW LOCK_LID SHTOF VALVE CAN PORT 3L (MED SURG SUPPLIES) ×2
NEEDLE HYPO  27GA 1.5IN STD MONOJECT SS POLYPROP REG BVL LL (MED SURG SUPPLIES) ×2
NEEDLE HYPO 18GA 1.5IN STD RE_G BVL LF (MED SURG SUPPLIES) ×2
PACK SURG ECLIPSE ENT I STRL LF (CUSTOM TRAYS & PACK) ×1 IMPLANT
PACK SURG SIRUS ENT I STRL LF (CUSTOM TRAYS & PACK) ×2
PEN SURG MRKNG DISP RLR LBL STRL LF  6IN (MED SURG SUPPLIES) ×1 IMPLANT
PEN SURG MRKNG DISP RLR LBL STRL LF 6IN (MED SURG SUPPLIES) ×2
SOL IRRG 0.9% NACL 1000ML PLASTIC PR BTL ISTNC N-PYRG STRL LF (MEDICATIONS/SOLUTIONS) ×1 IMPLANT
SOLUTION IRRG NS 2F7124 1000CC_12/CS (MEDICATIONS/SOLUTIONS) ×2
SPONGE GAUZE 4X4IN MDCHC COTTON 12 PLY TY 7 LF  STRL DISP (WOUND CARE SUPPLY) ×2 IMPLANT
SPONGE GAUZE 4X4IN MDCHC COTTO_N 12 PLY TY 7 LF STRL DISP (WOUND CARE/ENTEROSTOMAL SUPPLY) ×4
SUTURE 3-0 C-13 POLYSRB 30IN UNDYED BRD COAT ABS (SUTURE/WOUND CLOSURE) IMPLANT
SUTURE 4-0 PC-13 POLYSRB 18IN UNDYED BRD COAT ABS (SUTURE/WOUND CLOSURE) ×4 IMPLANT
SUTURE 5-0 PC-13 POLYSRB 18IN UNDYED BRD COAT ABS (SUTURE/WOUND CLOSURE) IMPLANT
SUTURE 5-0 PC-13 SURGIPRO2 18IN BLU MONOF NONAB (SUTURE/WOUND CLOSURE) IMPLANT
SUTURE 6-0 PC-13 SURGIPRO2 18IN BLU MONOF NONAB (SUTURE/WOUND CLOSURE) ×2 IMPLANT
SYRINGE LL 10ML LF  STRL GRAD N-PYRG DEHP-FR PVC FREE MED DISP (MED SURG SUPPLIES) ×1 IMPLANT
SYRINGE LL 10ML LF STRL MED D_ISP (MED SURG SUPPLIES) ×2
TOWEL 24X16IN COTTON BLU DISP SURG STRL LF (DRAPE/PACKS/SHEETS/OR TOWEL) ×4 IMPLANT
TUBE BUBBLE CONNECTING_8888280214 1EA/BX/CS (MED SURG SUPPLIES) ×2
TUBING SUCT CLR 100FT 3/16IN ARGYLE UNIV PVC NCDTV BBL NONST LF (MED SURG SUPPLIES) ×1 IMPLANT
TUBING SUCT CLR 6FT .25IN ARGYLE PVC NCDTV STR MALE FEMALE (MED SURG SUPPLIES) ×2
TUBING SUCT CLR 6FT .25IN ARGYLE PVC NCDTV STR MALE FEMALE MLD CONN STRL LF (MED SURG SUPPLIES) ×1 IMPLANT

## 2021-12-26 NOTE — OR Surgeon (Addendum)
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 who presents with the aforementioned defect and diagnosis. Risks, benefits, indications, alternatives and complications of procedure were discussed in great detail. Patient understands and would like to proceed. Informed signed consent obtained.  Risks include but are not limited to bleeding, infection, scarring, deformity, nerve injury and the possible need for a multistage procedure.            Description of Procedure:  After informed consent was reviewed and confirmed in the preoperative holding area the patient was transferred to the operating suite and placed on the table in the supine position.  The patient was correctly identified by name as well as wrist band.  Appropriate timeout was taken prior to the start of the procedure.  Next the anesthesiologist successfully induced and intubated the patient.  The table was then rotated 90 away from the anesthesia circuit. Next, the patient was then prepped and draped in a sterile fashion.  Thorough evaluation of the defect and planned incisions for a rotational advancement flap was  drawn with a skin marker.  1% lidocaine with 100,000 epinephrine was injected into the preplanned incisions.  Using #15 blade skin incisions/back cut was made.  Next adequate undermining along the periphery including elevation and mobilization of the flap encompassed 36 cm2 of total soft tissue rearrangement.  The flap was then rotated and advanced into the defect in a preplanned position.  Multiple deep dermal and subcutaneous tacking sutures with 4-0 and 5-0 Biosyn in a buried interrupted fashion was performed.  Donor site standing cutaneous deformity was resolved by removal of a burrows triangle to improve transitions.  Skin was closed with running 6-0 Prolene as well as a few strategically position simple interrupted 6-0 Prolene sutures.  Bactroban ointment was applied to wound.  Patient's care was then transferred to the anesthesiologist, patient was awoken and transferred to the postanesthesia care unit in stable condition.  All instrument needle and sponge counts were deemed correct at the end of the case.    [name], DO ,M.M.S. 12/26/2021 14:42  Otolaryngology/Facial Plastic Surgery

## 2021-12-26 NOTE — Interval H&P Note (Signed)
[facility]      H&P UPDATE FORM                                                                                  Roy [name], Roy [name], 61 y.o. male  Date of Admission:  12/26/2021  Date of Birth:  1960-06-15    12/26/2021    STOP: IF H&P IS GREATER THAN 30 DAYS FROM SURGICAL DAY COMPLETE NEW H&P IS REQUIRED.     H & P updated the day of the procedure.  1.  H&P completed within 30 days of surgical procedure and has been reviewed within 24 hours of admission but prior to surgery or a procedure requiring anesthesia services, the patient has been examined, and no change has occured in the patients condition since the H&P was completed.       Change in medications: No              Comments:     2.  Patient continues to be appropriate candidate for planned surgical procedure. YES    Roy Murdoch, DO

## 2021-12-26 NOTE — Anesthesia Postprocedure Evaluation (Signed)
Anesthesia Post Op Evaluation    Patient: Roy [name]  Procedure(s):  REPAIR OF MOHS LEFT CHEEK DEFECT WITH FLAP CLOSURE    Last Vitals:Temperature: 36.5 C (97.7 F) (12/26/21 0734)  Heart Rate: 80 (12/26/21 1202)  BP (Non-Invasive): 118/75 (12/26/21 1202)  Respiratory Rate: 15 (12/26/21 1202)  SpO2: 99 % (12/26/21 1202)    No notable events documented.    Patient is sufficiently recovered from the effects of anesthesia to participate in the evaluation and has returned to their pre-procedure level.  Patient location during evaluation: PACU       Patient participation: complete - patient participated  Level of consciousness: awake and alert and responsive to verbal stimuli    Pain management: adequate  Airway patency: patent    Anesthetic complications: no  Cardiovascular status: acceptable  Respiratory status: acceptable  Hydration status: acceptable  Patient post-procedure temperature: Pt Normothermic   PONV Status: Absent

## 2021-12-26 NOTE — Anesthesia Transfer of Care (Signed)
ANESTHESIA TRANSFER OF CARE   Roy [name] is a 61 y.o. ,male, Weight: 83.9 kg (185 lb)   had Procedure(s):  REPAIR OF MOHS LEFT CHEEK DEFECT WITH FLAP CLOSURE  performed  12/26/21   Primary Service: [name], DO    Past Medical History:   Diagnosis Date    Crohn's disease (CMS [facility])     Esophageal reflux     Fatty liver     Per liver ultrasounds    Panic disorder     Right acoustic neuroma (CMS [facility])       Allergy History as of 12/26/21        No Known Allergies                  I completed my transfer of care / handoff to the receiving personnel during which we discussed:  Access, Airway, All key/critical aspects of case discussed, Analgesia, Antibiotics, Expectation of post procedure, Fluids/Product, Gave opportunity for questions and acknowledgement of understanding, Labs and PMHx      Post Location: PACU                                                           Last OR Temp: Temperature: 36.5 C (97.7 F)  ABG:  POTASSIUM   Date Value Ref Range Status   12/11/2021 4.3 3.5 - 5.1 mmol/L Final     CALCIUM   Date Value Ref Range Status   12/11/2021 9.6 8.6 - 10.3 mg/dL Final     Airway:* No LDAs found *  Blood pressure 116/75, pulse 56, temperature 36.5 C (97.7 F), resp. rate 20, height 1.803 m ('5\' 11"'$ ), weight 83.9 kg (185 lb), SpO2 96 %.

## 2021-12-26 NOTE — Discharge Instructions (Addendum)
PERFORM WOUND CARE 4 TIMES A DAY AND APPLY OINTMENT AFTER EACH CLEANING, AVOID GETTING INCISION WET EXCEPT WHEN PERFORMING WOUND CARE.     MAY SHOWER TOMORROW EVENING    CALL THE DOCTOR IF ANY QUESTIONS OR CONCERNS, SUCH AS INCREASED PAIN, FOUL SMELLING DRAINAGE, AND A FEVER OF 100.4 OR GREATER.     Follow up with Dr.Lentner on Thursday August 8,2023 at 8:30 a.m.

## 2021-12-26 NOTE — OR PostOp (Signed)
Pt. Left without discharge papers. Called pt talked to wife about suture cleaning 4 times daily and also about follow up appointment on Thursday August 10 th at 8:30 a.am.

## 2021-12-26 NOTE — Anesthesia Preprocedure Evaluation (Signed)
ANESTHESIA PRE-OP EVALUATION  Planned Procedure: REPAIR OF MOHS LEFT CHEEK DEFECT WITH FLAP CLOSURE POSSIBLE SKIN GRAFT (Left: Face)  Review of Systems                   Pulmonary     Cardiovascular           GI/Hepatic/Renal    GERD and liver disease        Endo/Other         Neuro/Psych/MS        Cancer                      Physical Assessment      Airway       Mallampati: III      Mouth Opening: good.            Dental                    Pulmonary           Cardiovascular             Other findings            Plan  ASA 3     Planned anesthesia type: general

## 2021-12-27 ENCOUNTER — Encounter (INDEPENDENT_AMBULATORY_CARE_PROVIDER_SITE_OTHER): Payer: Self-pay | Admitting: OTOLARYNGOLOGY

## 2021-12-27 NOTE — H&P (Signed)
ENT, [place]  [place] [address]  Phone: [phone]  Fax: [phone]      Encounter Date: 12/25/2021    Patient ID: Roy [name]  MRN: [id number]    DOB: [birth year]  Age: 61 y.o. male     Progress Note       Referring Provider:  No ref. provider found    Reason for Visit:   Chief Complaint   Patient presents with    Skin Cancer     Patient here to follow up after MOHS left cheek        History of Present Illness:  Roy [name] is a 61 y.o. male who presents for follow-up after Mohs excision left cheek basal cell carcinoma.  Here for defect evaluation and repair planning.  No complaints.      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 Release 24 hr Take 4 Capsules (1.5 g total) by mouth Once a day    budesonide (ENTOCORT EC) 3 mg Oral Capsule, Delayed & Ext.Release Take 3 Capsules (9 mg total) by mouth Every night    cefdinir (OMNICEF) 300 mg Oral Capsule Take 1 Capsule (300 mg total) by mouth Twice daily for 10 days    clonazePAM (KLONOPIN) 1 mg Oral Tablet Take 1 Tablet (1 mg total) by mouth Twice per day as needed    doxepin (SINEQUAN) 10 mg Oral Capsule Take 2 Capsules (20 mg total) by mouth Every night (Patient not taking: Reported on 12/26/2021)    mupirocin (BACTROBAN) 2 % Ointment Apply topically Three times a day    omeprazole (PRILOSEC) 40 mg Oral Capsule, Delayed Release(E.C.) Take 1 Capsule (40 mg total) by mouth Once a day    RED YEAST RICE ORAL Take 1 Tablet by mouth Twice daily    sertraline (ZOLOFT) 50 mg Oral Tablet Take 1 Tablet (50 mg total) by mouth Once a day (Patient not taking: Reported on 12/26/2021)    TUMERIC-GING-OLIVE-OREG-CAPRYL ORAL Take 1 Capsule by mouth Once a day      No Known Allergies  Past Medical History:   Diagnosis Date    Crohn's disease (CMS [facility])      Esophageal reflux     Fatty liver     Per liver ultrasounds    Panic disorder     Right acoustic neuroma (CMS [facility])      Past Surgical History:   Procedure Laterality Date    HX NO SURGICAL PROCEDURES       Family Medical History:       Problem Relation (Age of Onset)    Pancreatitis Maternal Grandmother            Social History     Tobacco Use    Smoking status: Former     Packs/day: 1.00     Years: 10.00     Pack years: 10.00     Types: Cigarettes     Quit date: 02/10/1993     Years since quitting: 28.8    Smokeless tobacco: Never   Vaping Use    Vaping Use: Never used   Substance Use Topics    Alcohol use: Not Currently     Comment: Prior alcohol usage (6 pack beer daily in mid-late 1990's); Quit date  year 2000    Drug use: Not Currently       Review of Systems:  Review of Systems    Physical Exam:  Wt 86.2 kg (190 lb)   BMI 26.88 kg/m       Physical Exam  Constitutional:       Appearance: Normal appearance. He is well-developed, well-groomed and normal weight.   HENT:      Head: Normocephalic and atraumatic.      Comments: Left cheek Mohs defect see picture below     Right Ear: Hearing, tympanic membrane, ear canal and external ear normal.      Left Ear: Hearing, tympanic membrane, ear canal and external ear normal.      Nose: Septal deviation and mucosal edema present.      Right Turbinates: Enlarged.      Left Turbinates: Enlarged.      Mouth/Throat:      Lips: Pink.      Mouth: Mucous membranes are moist.      Pharynx: Oropharynx is clear. Uvula midline.   Eyes:      Extraocular Movements: Extraocular movements intact.   Neck:      Trachea: Phonation normal.   Pulmonary:      Effort: Pulmonary effort is normal.   Musculoskeletal:      Cervical back: Normal range of motion and neck supple.   Lymphadenopathy:      Cervical: No cervical adenopathy.   Skin:     General: Skin is warm.   Neurological:      Mental Status: He is alert and oriented to person, place, and time.      Cranial Nerves: Cranial nerves 2-12  are intact. No facial asymmetry.   Psychiatric:         Attention and Perception: Attention normal.         Mood and Affect: Mood normal.         Speech: Speech normal.         Behavior: Behavior normal. Behavior is cooperative.       Assessment:  ENCOUNTER DIAGNOSES     ICD-10-CM   1. 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  Reconstruction post Mohs surgery is indicated, reconstruction options discussed including flap closure, skin graft.  Risks, benefits, alternatives discussed at length.  All questions answered.  Patient wished to proceed.  Wound dressing changed  Rx cefdinir  Rx mupirocin ointment  Orders Placed This Encounter    cefdinir (OMNICEF) 300 mg Oral Capsule    mupirocin (BACTROBAN) 2 % Ointment     Return for Scheduled surgery.    Roy Murdoch, DO

## 2022-01-09 ENCOUNTER — Ambulatory Visit (INDEPENDENT_AMBULATORY_CARE_PROVIDER_SITE_OTHER): Payer: BC Managed Care – PPO | Admitting: OTOLARYNGOLOGY

## 2022-01-09 ENCOUNTER — Encounter (INDEPENDENT_AMBULATORY_CARE_PROVIDER_SITE_OTHER): Payer: Self-pay | Admitting: OTOLARYNGOLOGY

## 2022-01-09 ENCOUNTER — Other Ambulatory Visit: Payer: Self-pay

## 2022-01-09 VITALS — Wt 185.0 lb

## 2022-01-09 DIAGNOSIS — R0981 Nasal congestion: Secondary | ICD-10-CM

## 2022-01-09 DIAGNOSIS — J301 Allergic rhinitis due to pollen: Secondary | ICD-10-CM

## 2022-01-09 DIAGNOSIS — C44319 Basal cell carcinoma of skin of other parts of face: Secondary | ICD-10-CM

## 2022-01-09 NOTE — H&P (Signed)
ENT, [place]  [place] [address]  Phone: [phone]  Fax: [phone]      Encounter Date: 01/09/2022    Patient ID: Roy [name]  MRN: [id number]    DOB: Apr 18, 1961  Age: 61 y.o. male     Progress Note       Referring Provider:  No ref. provider found    Reason for Visit:   Chief Complaint   Patient presents with    Post Op     Patient here for 2 week post op following REPAIR OF MOHS LEFT CHEEK DEFECT on 12/26/21        History of Present Illness:  Roy [name] is a 61 y.o. male who presents for follow-up status post left cheek Mohs reconstruction with flap closure.  Here for wound check and suture removal.  No complaints.      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 Release 24 hr Take 4 Capsules (1.5 g total) by mouth Once a day    budesonide (ENTOCORT EC) 3 mg Oral Capsule, Delayed & Ext.Release Take 3 Capsules (9 mg total) by mouth Every night    clonazePAM (KLONOPIN) 1 mg Oral Tablet Take 1 Tablet (1 mg total) by mouth Twice per day as needed    doxepin (SINEQUAN) 10 mg Oral Capsule Take 2 Capsules (20 mg total) by mouth Every night (Patient not taking: Reported on 12/26/2021)    mupirocin (BACTROBAN) 2 % Ointment Apply topically Three times a day    omeprazole (PRILOSEC) 40 mg Oral Capsule, Delayed Release(E.C.) Take 1 Capsule (40 mg total) by mouth Once a day    RED YEAST RICE ORAL Take 1 Tablet by mouth Twice daily    sertraline (ZOLOFT) 50 mg Oral Tablet Take 1 Tablet (50 mg total) by mouth Once a day (Patient not taking: Reported on 12/26/2021)    TUMERIC-GING-OLIVE-OREG-CAPRYL ORAL Take 1 Capsule by mouth Once a day      No Known Allergies  Past Medical History:   Diagnosis Date    Crohn's disease (CMS [facility])     Esophageal reflux     Fatty liver     Per liver ultrasounds    Panic disorder      Right acoustic neuroma (CMS [facility])      Past Surgical History:   Procedure Laterality Date    HX NO SURGICAL PROCEDURES      SKIN CANCER EXCISION       Family Medical History:       Problem Relation (Age of Onset)    Pancreatitis Maternal Grandmother            Social History     Tobacco Use    Smoking status: Former     Packs/day: 1.00     Years: 10.00     Pack years: 10.00     Types: Cigarettes     Quit date: 02/10/1993     Years since quitting: 28.9    Smokeless tobacco: Never   Vaping Use    Vaping Use: Never used   Substance Use Topics    Alcohol use: Not Currently     Comment: Prior alcohol usage (6 pack beer daily in mid-late 1990's); Quit date year 2000    Drug  use: Not Currently       Review of Systems:  Review of Systems    Physical Exam:  Wt 83.9 kg (185 lb)   BMI 25.80 kg/m       Physical Exam  Constitutional:       Appearance: Normal appearance. He is well-developed, well-groomed and normal weight.   HENT:      Head: Normocephalic and atraumatic.      Comments: Left cheek sutures clean dry and intact.  Healing well.  Sutures removed.     Right Ear: Hearing, tympanic membrane, ear canal and external ear normal.      Left Ear: Hearing, tympanic membrane, ear canal and external ear normal.      Nose: Septal deviation and mucosal edema present.      Right Turbinates: Enlarged.      Left Turbinates: Enlarged.      Mouth/Throat:      Lips: Pink.      Mouth: Mucous membranes are moist.      Pharynx: Oropharynx is clear. Uvula midline.   Eyes:      Extraocular Movements: Extraocular movements intact.   Neck:      Trachea: Phonation normal.   Pulmonary:      Effort: Pulmonary effort is normal.   Musculoskeletal:      Cervical back: Normal range of motion and neck supple.   Lymphadenopathy:      Cervical: No cervical adenopathy.   Skin:     General: Skin is warm.   Neurological:      Mental Status: He is alert and oriented to person, place, and time.      Cranial Nerves: Cranial nerves 2-12 are intact. No  facial asymmetry.   Psychiatric:         Attention and Perception: Attention normal.         Mood and Affect: Mood normal.         Speech: Speech normal.         Behavior: Behavior normal. Behavior is cooperative.       Assessment:  ENCOUNTER DIAGNOSES     ICD-10-CM   1. 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 Chronic nasal congestion  R09.81       Plan:  Medical records reviewed on 01/09/2022.  Sutures removed, healing well  Continue wound care as prescribed    Return in about 3 months (around 04/11/2022), or if symptoms worsen or fail to improve.    Roy Murdoch, DO  01/09/2022, 07:47

## 2022-01-14 IMAGING — US ABD LIMITED
1 series · 14 of 25 positions shown · non-contrast
Comparison: 06/21/2018.

﻿EXAM:  [facility] READ ABD U/S LMTD
INDICATION: Fatty liver.

[Series 1: abd limited · 14 of 58 slices shown]
[im 1/58]
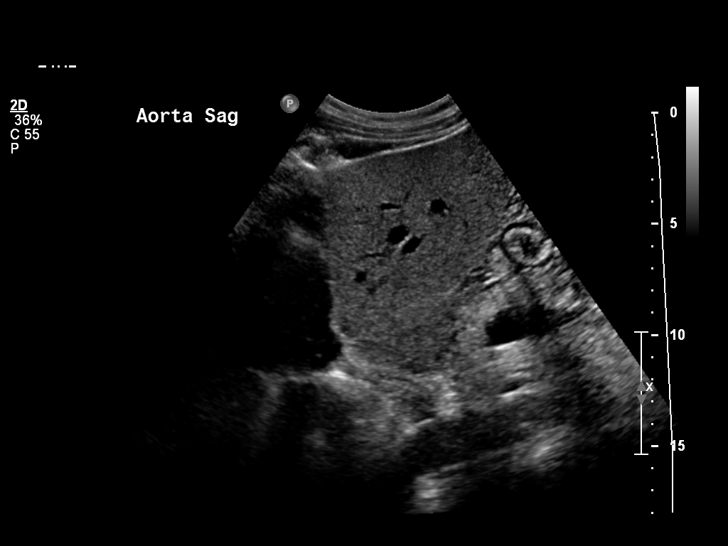
[im 5/58]
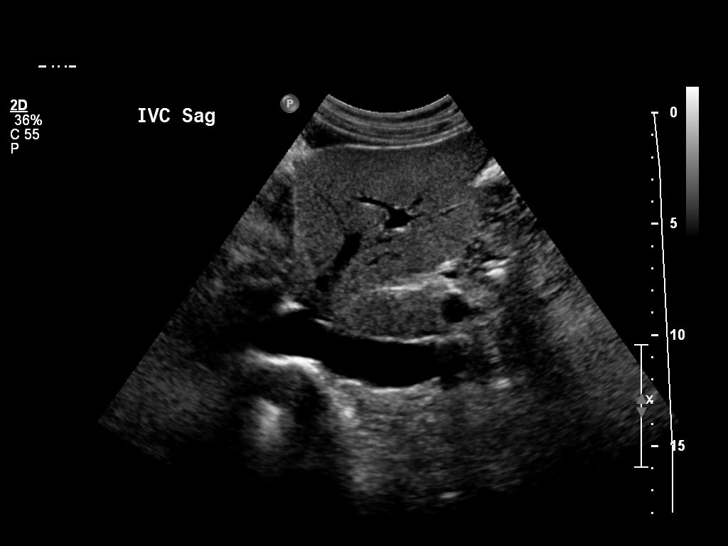
[im 10/58]
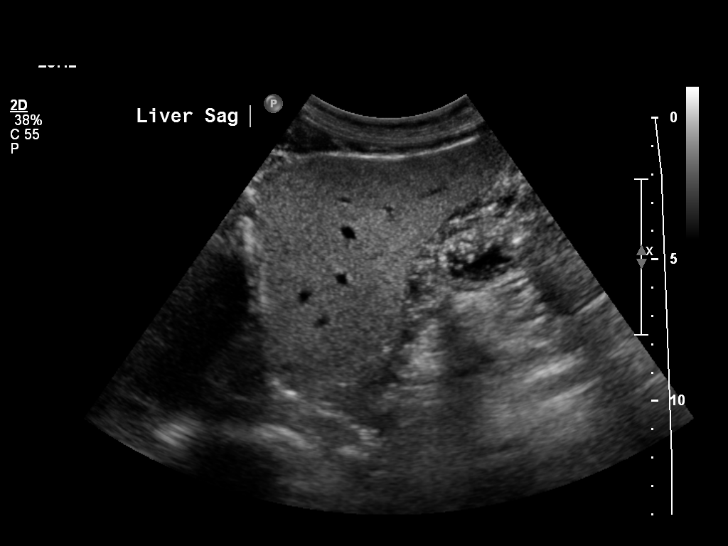
[im 15/58]
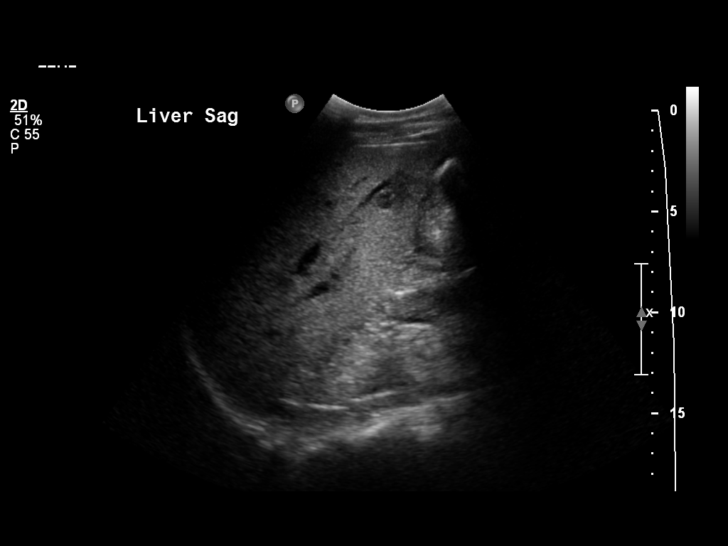
[im 20/58]
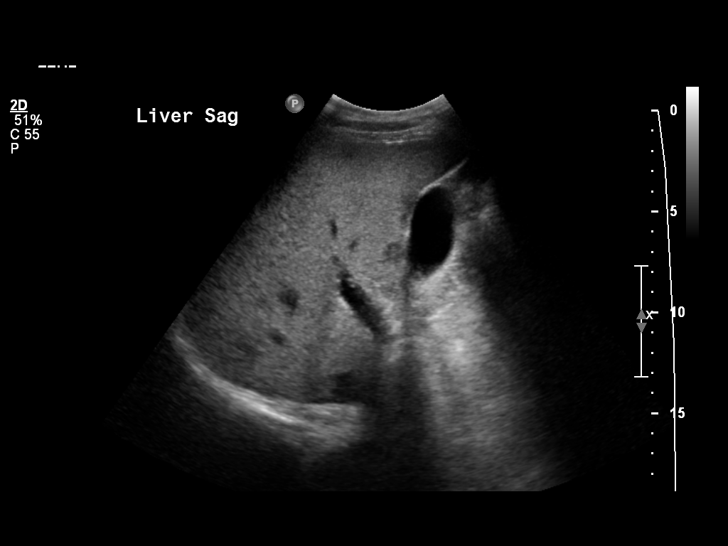
[im 22/58]
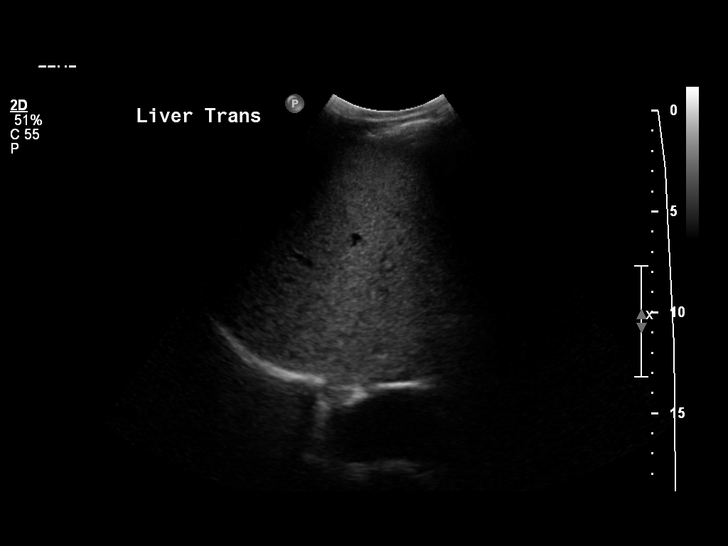
[im 27/58]
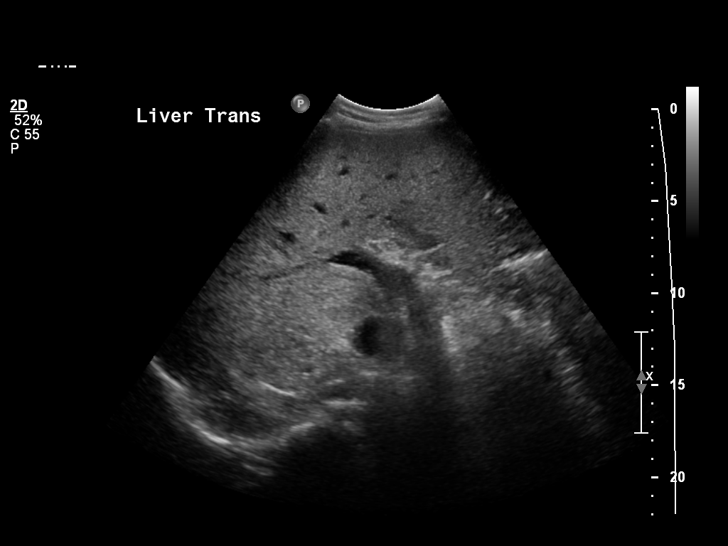
[im 31/58]
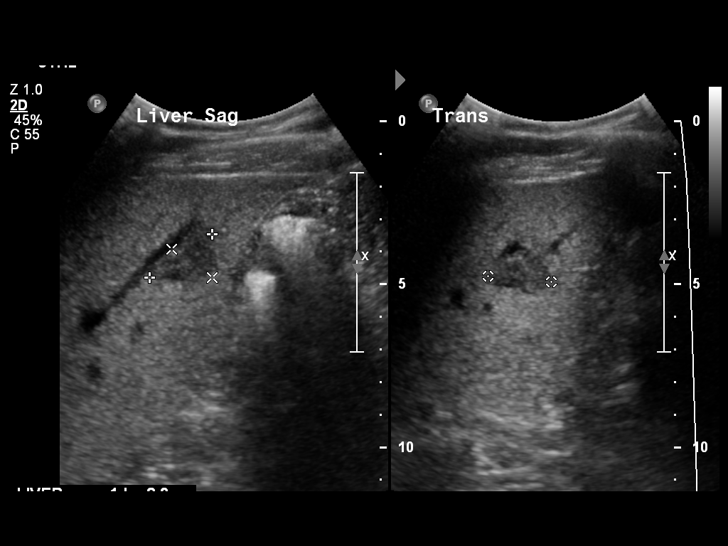
[im 36/58]
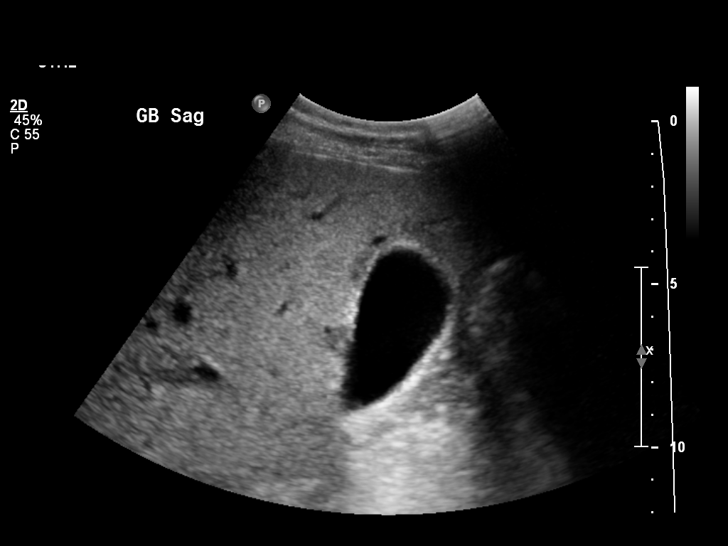
[im 39/58]
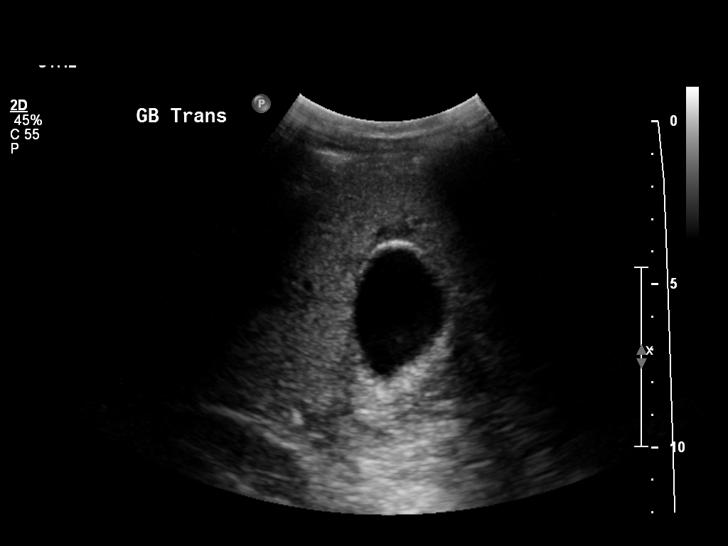
[im 43/58]
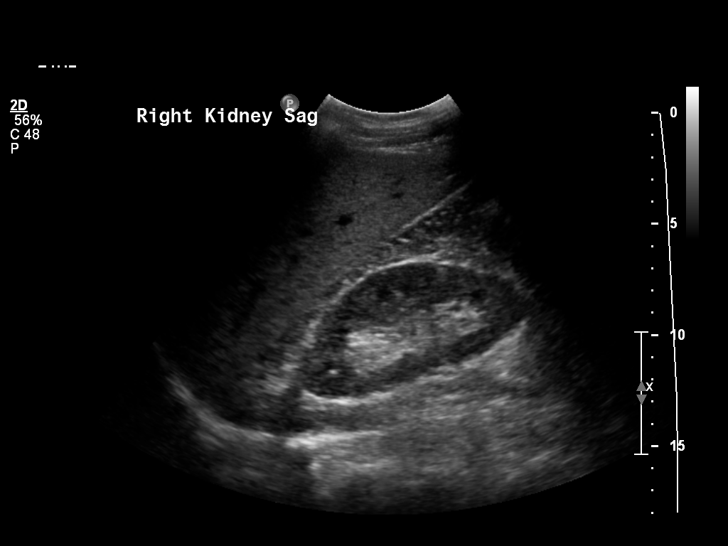
[im 48/58]
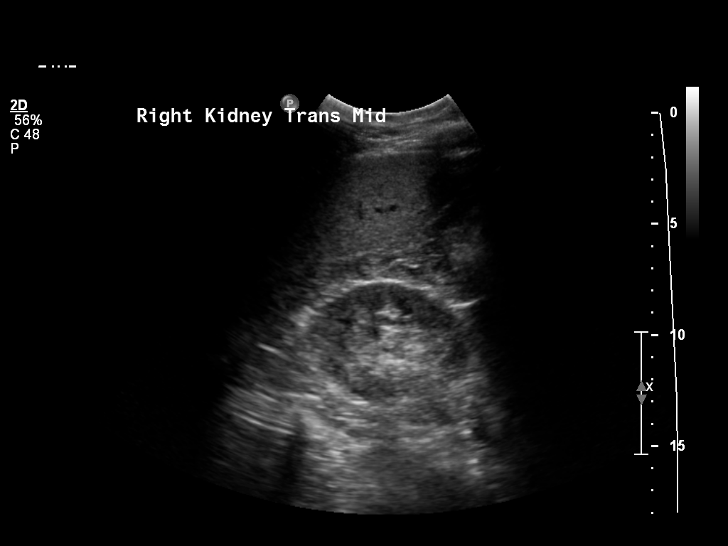
[im 53/58]
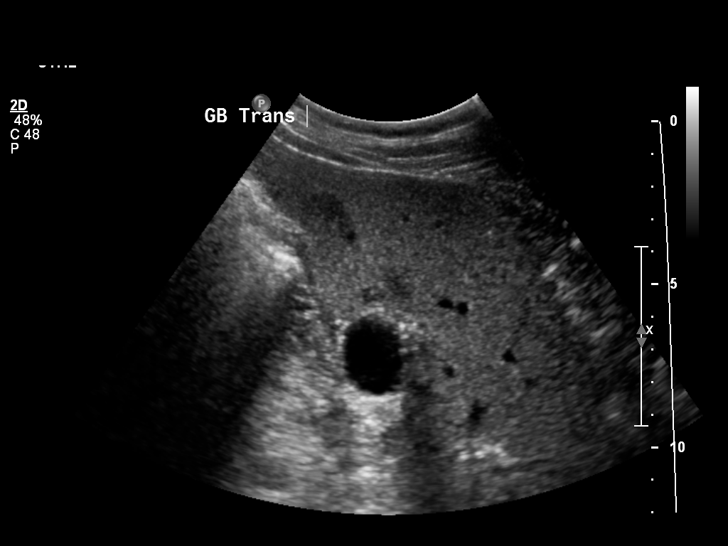
[im 58/58]
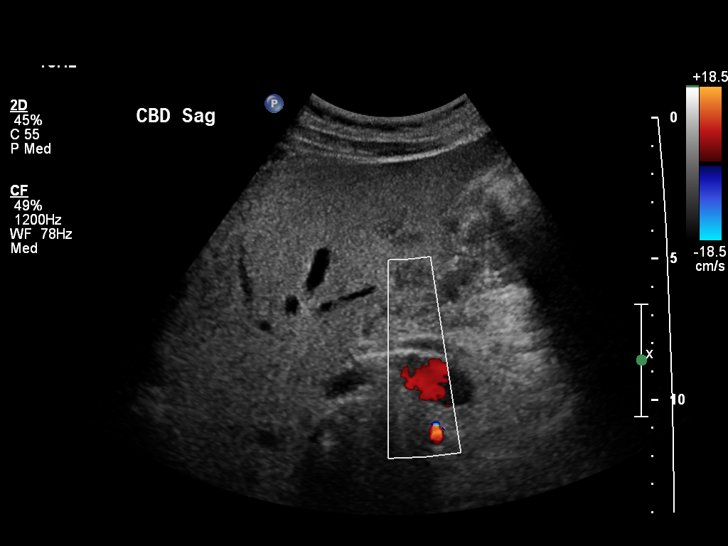

[14 of 25 positions shown; findings below may reference images not displayed]

FINDINGS: Liver is echogenic compatible with fatty infiltration. There is a 2.5 cm hypoechoic area within the right hepatic lobe. There is no intra or extrahepatic biliary ductal dilatation. Common bile duct measures 4.5 mm. No sludge or shadowing gallstones are seen. There is no gallbladder wall thickening or pericholecystic fluid. Pancreas is incompletely visualized due to artifact from overlying bowel gas. Right kidney measures 12.5 cm and is normal.

Visualized abdominal aorta is without aneurysmal dilatation. IVC is normal. Portal vein measures 13 mm in diameter and demonstrates hepatopetal flow. Hepatic veins are also patent. There is no ascites.
IMPRESSION: 1. Fatty liver. A 2.5 cm hypoechoic area within the right hepatic lobe. Follow-up contrast-enhanced CT scan as per liver mass protocol is recommended for better assessment. 

2. No evidence of cholelithiasis or acute cholecystitis. 

3. Pancreas incompletely visualized due to artifact from overlying bowel gas.

## 2022-03-13 ENCOUNTER — Other Ambulatory Visit: Payer: BC Managed Care – PPO | Attending: Family Medicine

## 2022-03-13 ENCOUNTER — Other Ambulatory Visit: Payer: Self-pay

## 2022-03-13 DIAGNOSIS — R944 Abnormal results of kidney function studies: Secondary | ICD-10-CM | POA: Insufficient documentation

## 2022-03-13 LAB — CREATININE WITH EGFR
CREATININE: 1.16 mg/dL (ref 0.60–1.30)
ESTIMATED GFR: 73 mL/min/{1.73_m2} (ref >59)

## 2022-03-17 ENCOUNTER — Ambulatory Visit (HOSPITAL_COMMUNITY): Payer: Self-pay | Admitting: PAIN MANAGEMENT

## 2022-04-07 ENCOUNTER — Other Ambulatory Visit (HOSPITAL_COMMUNITY): Payer: Self-pay | Admitting: Internal Medicine

## 2022-04-07 DIAGNOSIS — I998 Other disorder of circulatory system: Secondary | ICD-10-CM

## 2022-04-08 IMAGING — MR MRI BRAIN WITHOUT AND WITH CONTRAST
10 of 13 series · 33 of 48 positions shown · IV contrast (gadavist)
Comparison: Previous outside imaging studies dated 04/11/2020, 04/13/2019.

﻿EXAM:  MRI BRAIN WITHOUT AND WITH CONTRAST INCLUDING HIGH-RESOLUTION IMAGES OF INTERNAL AUDITORY CANALS AND CEREBELLOPONTINE ANGLES:
INDICATION: 60-year-old male for follow-up of schwannoma of the right side.  No mention of any treatment or surgery.
TECHNIQUE: Multiplanar, multisequential MRI of the brain was performed without and with 10 mL of Gadavist.  Thin section high-resolution images of posterior fossa and cerebellopontine angles and internal auditory canals were obtained.

[Series 5: DWI · axial · 5.0mm · 1.35mm/px · z∈[-52,+86]mm · 8 of 96 slices shown (1 of 3)]
[im 1/96]
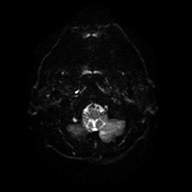
[im 11/96]
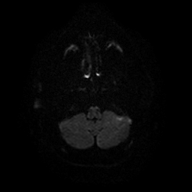
[im 32/96]
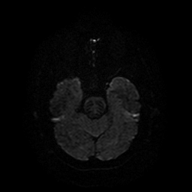
[im 43/96]
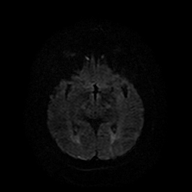
[im 53/96]
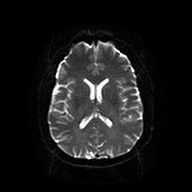
[im 64/96]
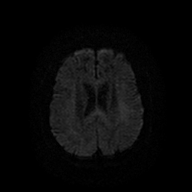
[im 85/96]
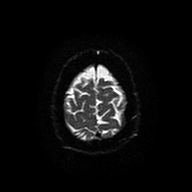
[im 96/96]
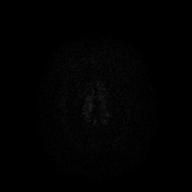

[Series 6: DWI · axial · 5.0mm · 1.35mm/px · z∈[-52,+86]mm · 3 of 24 slices shown (2 of 3)]
[im 1/24]
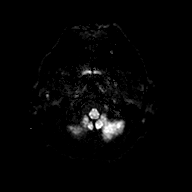
[im 12/24]
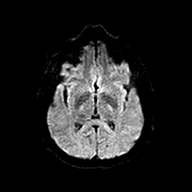
[im 24/24]
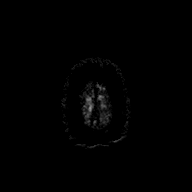

[Series 7: DWI · axial · 5.0mm · 1.35mm/px · z∈[-52,+86]mm · 3 of 24 slices shown (3 of 3)]
[im 1/24]
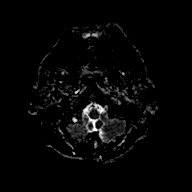
[im 12/24]
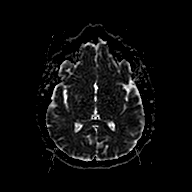
[im 24/24]
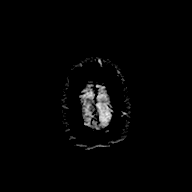

[Series 8: FLAIR · sagittal · 4.2mm · 0.75mm/px · 3 of 26 slices shown (1 of 2)]
[im 1/26]
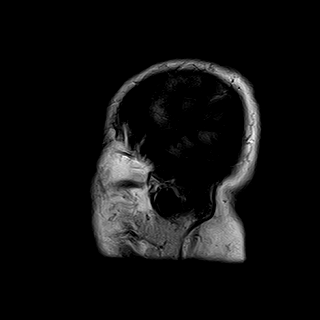
[im 13/26]
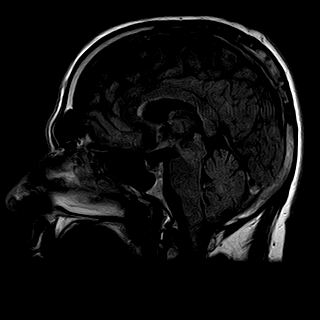
[im 26/26]
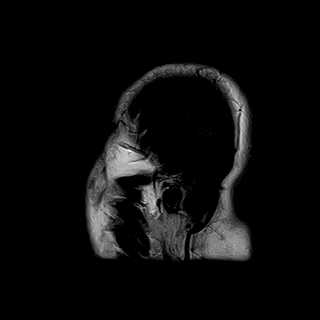

[Series 10: T2 · axial · 4.2mm · 0.43mm/px · z∈[-54,+93]mm · 4 of 36 slices shown]
[im 1/36]
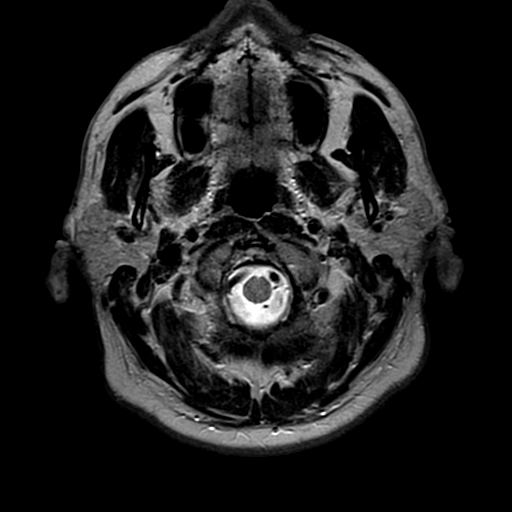
[im 12/36]
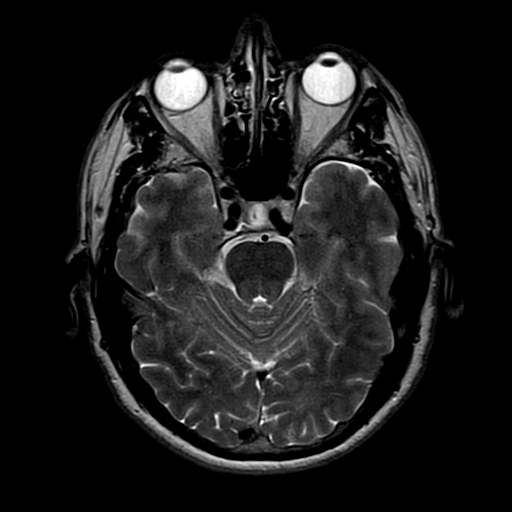
[im 24/36]
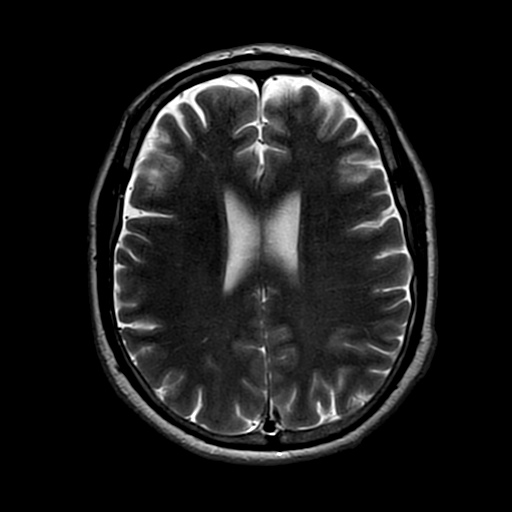
[im 36/36]
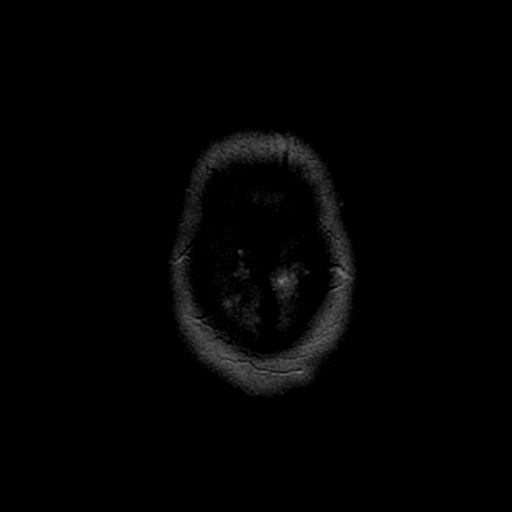

[Series 11: FLAIR · axial · 4.2mm · 0.43mm/px · z∈[-54,+93]mm · 4 of 36 slices shown (2 of 2)]
[im 1/36]
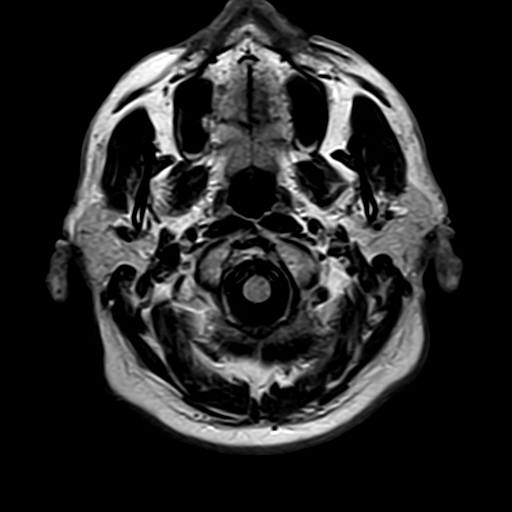
[im 12/36]
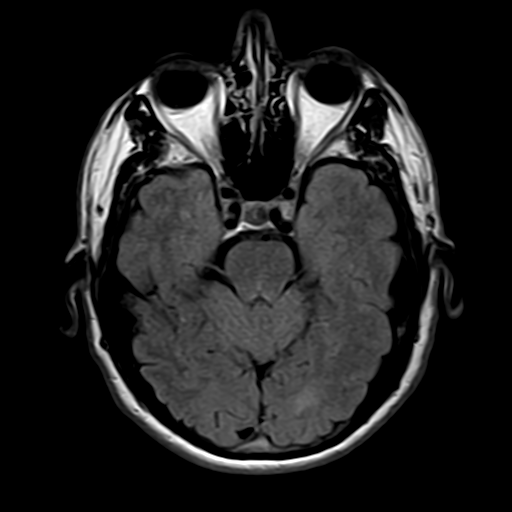
[im 24/36]
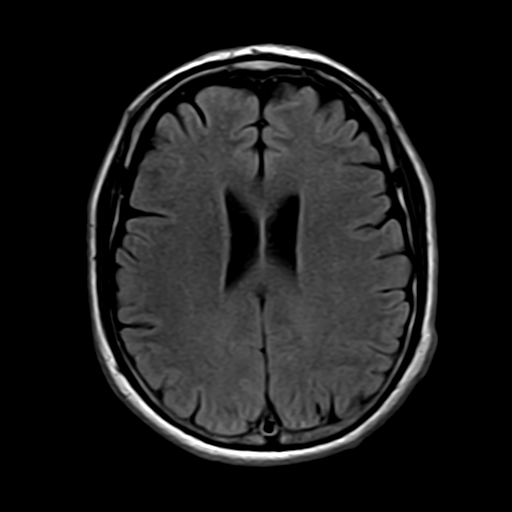
[im 36/36]
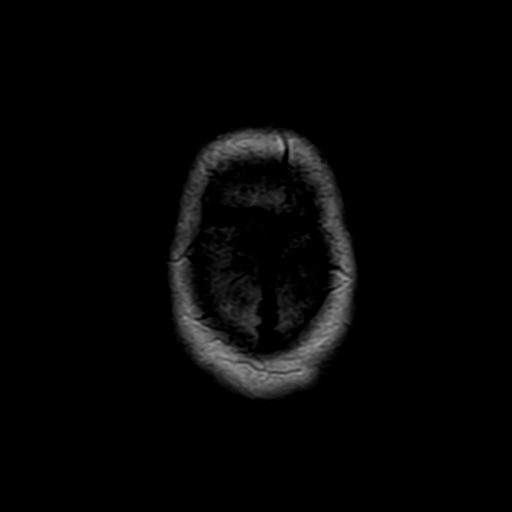

[Series 12: T1 · axial · 4.2mm · 0.43mm/px · z∈[-54,-8]mm · 2 of 36 slices shown]
[im 1/36]
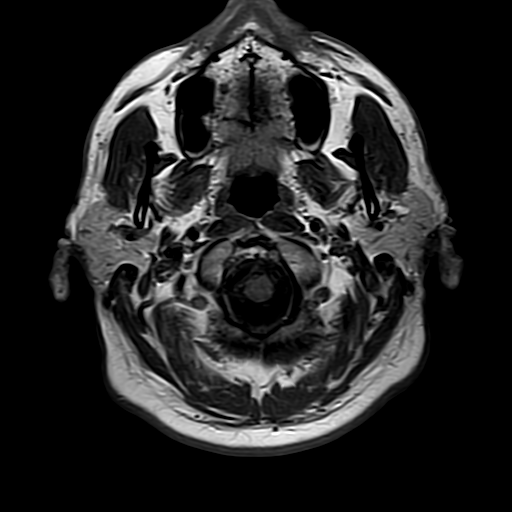
[im 12/36]
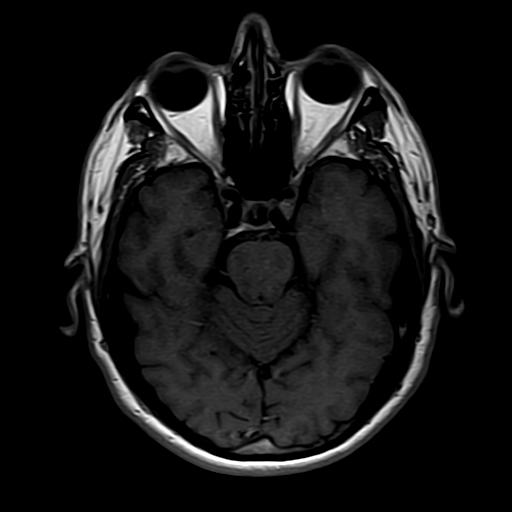

[Series 16: T1 post-contrast · axial · 3.0mm · 0.47mm/px · 1 of 11 slices shown (1 of 3)]
[im 1/11]
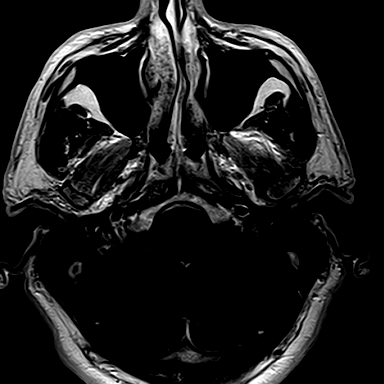

[Series 17: T1 post-contrast · coronal · 3.0mm · 0.47mm/px · 1 of 11 slices shown (2 of 3)]
[im 1/11]
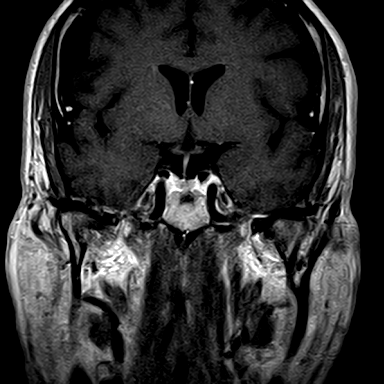

[Series 18: T1 post-contrast · axial · 4.2mm · 0.43mm/px · z∈[-54,+93]mm · 4 of 36 slices shown (3 of 3)]
[im 1/36]
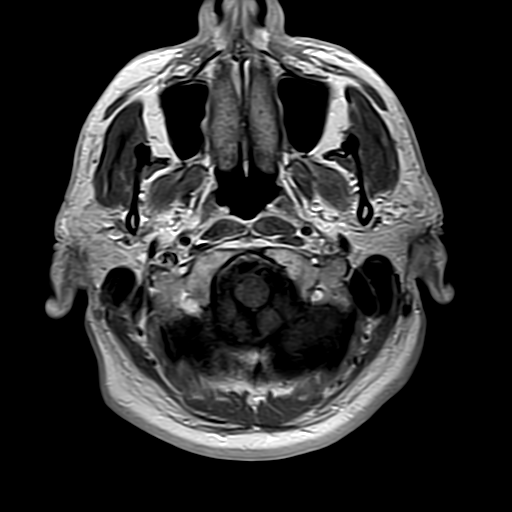
[im 12/36]
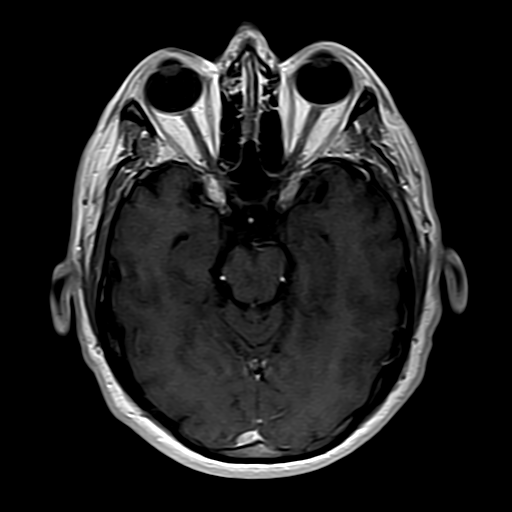
[im 24/36]
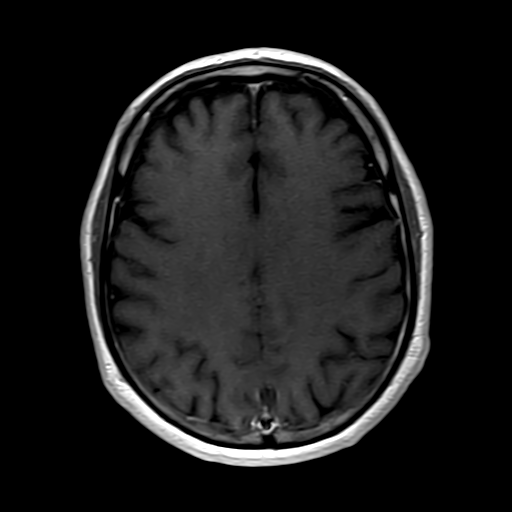
[im 36/36]
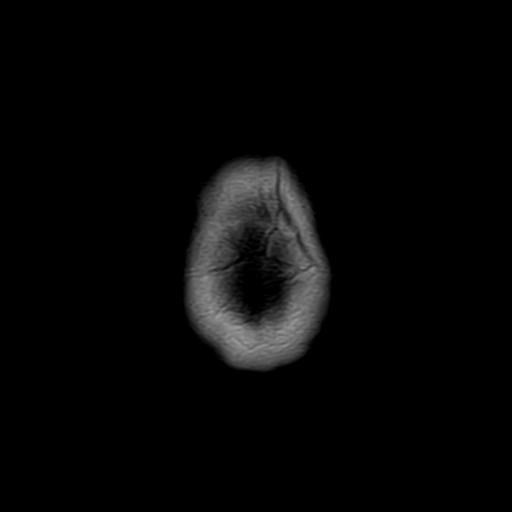

[33 of 48 positions shown; findings below may reference images not displayed]

FINDINGS: No evidence of acute ischemia, ventriculomegaly or midline shift is noted.  Major arteries of circle of Willis and dural venous sinuses are patent.

In the posterior cranial fossa, evidence of schwannoma with uniform enhancement is noted on the right side with intracanalicular and extracanalicular components. The extracanalicular component at the cerebellopontine angle is larger. Overall size of the right-sided schwannoma measures 15 x 10 x 10 mm on postcontrast study in transverse, AP and superoinferior dimensions.  This compares with the size of 14 x 11 x 11 mm on 04/11/2020 and 13 x 11 x 8 mm on 04/13/2019.

Normal findings on the left side.
IMPRESSION: 1.  Right-sided schwannoma with intracanalicular and extracanalicular components are noted measuring a size of 15 x 10 x 10 mm as mentioned above.  This is slightly larger compared with earlier study of 04/13/2019 at which time it measured 13 x 11 x 8 mm.

2. Remaining findings are within normal limits and unchanged from prior study.

## 2022-04-11 ENCOUNTER — Inpatient Hospital Stay
Admission: RE | Admit: 2022-04-11 | Discharge: 2022-04-11 | Disposition: A | Discharge status: Home / Self Care | Payer: BC Managed Care – PPO | Source: Ambulatory Visit | Attending: Internal Medicine | Admitting: Internal Medicine

## 2022-04-11 ENCOUNTER — Other Ambulatory Visit: Payer: Self-pay

## 2022-04-11 DIAGNOSIS — I998 Other disorder of circulatory system: Secondary | ICD-10-CM

## 2022-04-15 ENCOUNTER — Other Ambulatory Visit: Payer: Self-pay

## 2022-04-15 ENCOUNTER — Ambulatory Visit (INDEPENDENT_AMBULATORY_CARE_PROVIDER_SITE_OTHER): Payer: BC Managed Care – PPO | Admitting: OTOLARYNGOLOGY

## 2022-04-15 ENCOUNTER — Encounter (INDEPENDENT_AMBULATORY_CARE_PROVIDER_SITE_OTHER): Payer: Self-pay | Admitting: OTOLARYNGOLOGY

## 2022-04-15 VITALS — Ht 71.0 in | Wt 185.0 lb

## 2022-04-15 DIAGNOSIS — J301 Allergic rhinitis due to pollen: Secondary | ICD-10-CM

## 2022-04-15 DIAGNOSIS — Z86018 Personal history of other benign neoplasm: Secondary | ICD-10-CM

## 2022-04-15 DIAGNOSIS — H6993 Unspecified Eustachian tube disorder, bilateral: Secondary | ICD-10-CM

## 2022-04-15 DIAGNOSIS — Z85828 Personal history of other malignant neoplasm of skin: Secondary | ICD-10-CM

## 2022-04-15 DIAGNOSIS — R0981 Nasal congestion: Secondary | ICD-10-CM

## 2022-04-15 NOTE — H&P (Signed)
[facility], [place]  [place] [address]  Phone: [phone]  Fax: [phone]      Encounter Date: 04/15/2022    Patient ID: Roy [name]  MRN: [id number]    DOB: [birth year]  Age: 61 y.o. male     Progress Note       Referring Provider:  No ref. provider found    Reason for Visit:   Chief Complaint   Patient presents with    Follow Up 3 Months             History of Present Illness:  Roy [name] is a 61 y.o. male who presents for follow-up on history of skin cancer/Mohs excision and reconstruction.  Denies any new or concerning skin lesions.  No complaints.Patient also has a history of a right acoustic neuroma is being monitored by Neurootology at [facility]. His last MRI was 2,1/2 years ago. Denies any change in hearing / weakness or vertigo.         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 Release 24 hr Take 4 Capsules (1.5 g total) by mouth Once a day    budesonide (ENTOCORT EC) 3 mg Oral Capsule, Delayed & Ext.Release Take 3 Capsules (9 mg total) by mouth Every night    clonazePAM (KLONOPIN) 1 mg Oral Tablet Take 1 Tablet (1 mg total) by mouth Twice per day as needed    mupirocin (BACTROBAN) 2 % Ointment Apply topically Three times a day    omeprazole (PRILOSEC) 40 mg Oral Capsule, Delayed Release(E.C.) Take 1 Capsule (40 mg total) by mouth Once a day    RED YEAST RICE ORAL Take 1 Tablet by mouth Twice daily    TUMERIC-GING-OLIVE-OREG-CAPRYL ORAL Take 1 Capsule by mouth Once a day      No Known Allergies  Past Medical History:   Diagnosis Date    Crohn's disease (CMS HCC)     Esophageal reflux     Fatty liver     Per liver ultrasounds    Panic disorder     Right acoustic neuroma (CMS HCC)      Past Surgical History:   Procedure Laterality Date    HX NO SURGICAL PROCEDURES       SKIN CANCER EXCISION       Family Medical History:       Problem Relation (Age of Onset)    Pancreatitis Maternal Grandmother            Social History     Tobacco Use    Smoking status: Former     Packs/day: 1.00     Years: 10.00     Additional pack years: 0.00     Total pack years: 10.00     Types: Cigarettes     Quit date: 02/10/1993     Years since quitting: 29.1    Smokeless tobacco: Never   Vaping Use    Vaping Use: Never used   Substance Use Topics    Alcohol use: Not Currently     Comment: Prior alcohol usage (6 pack beer daily in mid-late 1990's); Quit date year 2000    Drug use: Not Currently       Review of Systems:  Review of Systems  Physical Exam:  Ht 1.803 m ('5\' 11"'$ )   Wt 83.9 kg (185 lb)   BMI 25.80 kg/m       Physical Exam  Constitutional:       Appearance: Normal appearance. He is well-developed, well-groomed and normal weight.   HENT:      Head: Normocephalic and atraumatic.      Comments: Left Mohs melolabial repair site well healed     Right Ear: Hearing, tympanic membrane, ear canal and external ear normal.      Left Ear: Hearing, tympanic membrane, ear canal and external ear normal.      Nose: Septal deviation and mucosal edema present.      Right Turbinates: Enlarged.      Left Turbinates: Enlarged.      Mouth/Throat:      Lips: Pink.      Mouth: Mucous membranes are moist.      Pharynx: Oropharynx is clear. Uvula midline.   Eyes:      Extraocular Movements: Extraocular movements intact.   Neck:      Trachea: Phonation normal.   Pulmonary:      Effort: Pulmonary effort is normal.   Musculoskeletal:      Cervical back: Normal range of motion and neck supple.   Lymphadenopathy:      Cervical: No cervical adenopathy.   Skin:     General: Skin is warm.   Neurological:      Mental Status: He is alert and oriented to person, place, and time.      Cranial Nerves: Cranial nerves 2-12 are intact. No facial asymmetry.   Psychiatric:         Attention and Perception: Attention normal.          Mood and Affect: Mood normal.         Speech: Speech normal.         Behavior: Behavior normal. Behavior is cooperative.         Assessment:  ENCOUNTER DIAGNOSES     ICD-10-CM   1. H/O nonmelanoma skin cancer  Z85.828   2. 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 Chronic nasal congestion  R09.81   4. ETD (Eustachian tube dysfunction), bilateral  H69.93   5. History of acoustic neuroma  Z86.018         Plan:  Medical records reviewed on 04/15/2022.  No new or concerning skin lesions on exam today  Sunscreen use sun avoidance encouraged  Continue routine dermatologic surveillance  Nasal saline b.i.d.  Followed by Neuro otology at [facility] for history of acoustic neuroma    Follow-up in 4-6 months or sooner PRN    [name], DO

## 2022-05-14 ENCOUNTER — Other Ambulatory Visit: Payer: Self-pay

## 2022-05-14 ENCOUNTER — Other Ambulatory Visit (HOSPITAL_COMMUNITY): Payer: Self-pay | Admitting: PAIN MANAGEMENT

## 2022-05-14 DIAGNOSIS — M5416 Radiculopathy, lumbar region: Secondary | ICD-10-CM

## 2022-05-15 ENCOUNTER — Encounter (INDEPENDENT_AMBULATORY_CARE_PROVIDER_SITE_OTHER): Payer: Self-pay | Admitting: Student in an Organized Health Care Education/Training Program

## 2022-05-15 ENCOUNTER — Ambulatory Visit (INDEPENDENT_AMBULATORY_CARE_PROVIDER_SITE_OTHER): Payer: BC Managed Care – PPO | Admitting: Student in an Organized Health Care Education/Training Program

## 2022-05-15 VITALS — BP 137/90 | HR 60 | Ht 71.0 in | Wt 197.0 lb

## 2022-05-15 DIAGNOSIS — N492 Inflammatory disorders of scrotum: Secondary | ICD-10-CM

## 2022-05-15 DIAGNOSIS — N400 Enlarged prostate without lower urinary tract symptoms: Secondary | ICD-10-CM

## 2022-05-15 DIAGNOSIS — Z87438 Personal history of other diseases of male genital organs: Secondary | ICD-10-CM

## 2022-05-15 NOTE — Progress Notes (Signed)
[place]    Progress Note    Name: Roy [name] MRN:  [id number]   Date: 05/15/2022 Age: 61 y.o.       Chief Complaint: Follow Up (F/u   BPH (11/13/21)   PSA 1.8 on 05/05/22    )    Subjective:   Roy [name] is a pleasant 61 year old male with a history of BPH and a scrotal abscess requiring incision and drainage in 2022. He presents today for follow up. He denies complaints. He denies fevers, chills, nausea, vomiting, hematuria, dysuria, flank pain, incontinence, dribbling, hesitancy, suprapubic pain, headaches, vision changes, shortness of breath, chest pain.      PSA in 2019 was 1.3 ng/mL  PSA 05/21/20 was 1.45 ng/mL  PSA 05/09/21 was 1.39 ng/mL  PSA 05/05/22: was 1.8  Objective :  BP (!) 137/90 (Site: Right, Patient Position: Sitting, Cuff Size: Adult)   Pulse 60   Ht 1.803 m ('5\' 11"'$ )   Wt 89.4 kg (197 lb)   BMI 27.48 kg/m       Gen: NAD, alert  Pulm: unlabored at rest  CV: palpable pulses  Abd: soft, Nt/ND  GU: no suprapubic tenderness, no CVAT        Data reviewed:    Current Outpatient Medications   Medication Sig    APRISO 0.375 gram Oral Capsule, Sust. Release 24 hr Take 4 Capsules (1.5 g total) by mouth Once a day    budesonide (ENTOCORT EC) 3 mg Oral Capsule, Delayed & Ext.Release Take 3 Capsules (9 mg total) by mouth Every night    clonazePAM (KLONOPIN) 1 mg Oral Tablet Take 1 Tablet (1 mg total) by mouth Twice per day as needed    mupirocin (BACTROBAN) 2 % Ointment Apply topically Three times a day    omeprazole (PRILOSEC) 40 mg Oral Capsule, Delayed Release(E.C.) Take 1 Capsule (40 mg total) by mouth Once a day    RED YEAST RICE ORAL Take 1 Tablet by mouth Twice daily    TUMERIC-GING-OLIVE-OREG-CAPRYL ORAL Take 1 Capsule by mouth Once a day     Assessment/Plan  Problem List Items Addressed This Visit    None        History of scrotal abscess s/p I&D on 02/18/2021  No further workup at this time        BPH/LUTS  Discussed the differential diagnosis,  pathophysiology and nature of benign prostate enlargement causing lower urinary tract symptoms  Counseled patient on conservative management options including appropriate fluid management, avoidance of diuretics including caffeine and alcohol        Prostate cancer screening  Based on patient's clinical findings including his recent prostate specific antigen level, age, ethnicity and relevant risk factors and in accordance with the American Urological Association (AUA) & [place] (NCCN) published screening guidelines, I would recommend the following:  Continued annual prostate specific antigen & digital rectal exam screening with cessation of screening at age 58 or upon developing more serious health issues.  PSA in one year          Landis Gandy, DO       A combined total of 45 minutes were spent preparing to see the patient, reviewing previous records, ordering tests/medications/procedures, documenting the clinical encounter as well as performing a medically appropriate evaluation and independently interpreting results and communicating them to the patient/family/caregiver as specifically outlined above in the impression and plan.    This note may have been fully or  partially generated using MModal Fluency Direct system, and there may be some incorrect words, spellings, and punctuation that were not identified in checking the note before saving.

## 2022-05-20 ENCOUNTER — Other Ambulatory Visit: Payer: Self-pay

## 2022-05-20 ENCOUNTER — Ambulatory Visit (HOSPITAL_COMMUNITY)
Admission: RE | Admit: 2022-05-20 | Discharge: 2022-05-20 | Disposition: A | Discharge status: Still In Hospital | Payer: BC Managed Care – PPO | Source: Ambulatory Visit | Attending: PAIN MANAGEMENT | Admitting: PAIN MANAGEMENT

## 2022-05-20 DIAGNOSIS — M5137 Other intervertebral disc degeneration, lumbosacral region: Secondary | ICD-10-CM

## 2022-05-20 DIAGNOSIS — M5416 Radiculopathy, lumbar region: Secondary | ICD-10-CM | POA: Insufficient documentation

## 2022-05-20 NOTE — PT Evaluation (Signed)
[facility]  Outpatient Physical Therapy  [place], [address]  [phone]  [phone]      Physical Therapy Lumbar Spine Evaluation    Date: 05/20/2022  Patient's Name: Roy [name]  Date of Birth: 1961/02/11    Referral Diagnosis: radiculopathy, lumbar region           SUBJECTIVE  Chief Complaint/MOI/History of Current Episode: Patient reports 2 years of chronic LBP with left radicular symptoms.  Relates sciatica type pain down the posterior side of left thigh, skips the knee and lower leg region, and appears again in the left foot/heel area.  Patient relates no specific low back pain, but does have radicular symptoms which him and doctors think it may be originating from the lower back.  Gets charlie horses in the foot.  When this first started 2 years ago, he got injections which did help.  Patient relates sitting and lying down can reproduce symptoms.      Pain location: left radicular symptoms                    Pain description: TINGLING and cramping    Pain frequency:  INTERMITTENT    Pain rating: Now 5   Best 0   Worst 8    Pain increases with: ACTIVITY and SIT           decreases with : WALK    Weakness: Denies    Sleep affected: Unable to sleep on the left side    Bowel/bladder problems: Denies    Previous episodes/treatments: Injections in the past helped    Diagnostic tests: None on record - but patient states CT scan revealed a bulging disc    Medications for this problem:  n/a    Next MD visit: TBD    Significant PMH:    Past Medical History:   Diagnosis Date    Crohn's disease (CMS HCC)     Esophageal reflux     Fatty liver     Per liver ultrasounds    Panic disorder     Right acoustic neuroma (CMS HCC)             Occupation:  Architect and farm work    Patient goals: REDUCE PAIN and NORMALIZE FUNCTION    Subjective Functional Reports:    Sitting: LIMITED and 30 minutes    Standing: WFL and increased symptoms by the end of the  day    Walking: [facility] and increased symptoms by the end of the day    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 Prolonged sitting / relaxing at home 5   Total: 5    OBJECTIVE    Posture:  weight shifted to the right side off the left hip     AROM   Right Left   Flexion 80    Extension 20    Sideglide 25 25     ROM comments: repeated flexion caused increased tightness in the left hamstring    Strength MMT (/5)  WFL      Reflexes  Patellar 1   Achilles 1     Special tests:   Slump test + on the left for "electricity" down the left leg   SLR test + on the left for hamstring pain     Palpation: unremarkable  Joint Mobility: hypomobility through the L1-L5 segments    Sensation: Decreased sensation at L5S1 dermatome     Flexibility: Significant left hip flexibility deficits     Gait: WFL    During repeated prone extension testing, patient had mixed reports initially reporting centralization of symptoms, then had increased left ankle symptoms, then centralization of symptoms again.  Due to time constraints, will continue to assess extension based approach to address left radicular symptoms.    Treatment provided: REVIEW OF POC AND GOALS WITH PATIENT, ALL QUESTIONS ANSWERED, PATIENT EDUCATION, and THERAPEUTIC EXERCISE     Access Code: [email or web address]  URL: [email or web address]  Date: 05/20/2022  Prepared by: Cristie Hem Jaeger [place]heart    Exercises  - Supine Figure 4 Piriformis Stretch  - 3 x daily - 7 x weekly - 1 sets - 2 reps - 30 second hold  - Prone Press Up  - 3 x daily - 7 x weekly - 2 sets - 10 reps  - Supine 90/90 Sciatic Nerve Glide with Knee Flexion/Extension  - 3 x daily - 7 x weekly - 1 sets - 20 reps  - Supine Sciatic Nerve Glide  - 3 x daily - 7 x weekly - 1 sets - 20 reps          ASSESSMENT    61 y.o. M presents to outpatient PT referred for lumbar radiculopathy.  Patient demonstrates deficits in  left posterior chain neural tension and lumbar radiculopathy, decreased left hip flexibility.  He will benefit from outpatient PT to address the above deficits to reach the highest functional level possible.    Patient tolerated evaluation and treatment well.  He demonstrated and verbalized understanding of HEP and patient education.     Rehab potential: FAIR    Goals:  STGs 3 Weeks  Patient will demonstrate improved left hip mobility with (-) FABER test to aid in completion of ADLs.  Patient will be independent with progressive HEP to maximize gains from PT.  Patient will report max 4 LBP to aid in participation in PT.  Patient will reports 55% or more improvement in left radicular symptoms to aid with work duties    LTGs 6 Weeks  Patient will report abolishment of LLE radicular symptoms to aid in work.  Patient will demonstrate 10 squats/sit <> stands with 10# and good form to aid in independent completion of ADLs/IADLs.  Patient will demonstrate improved functional ability via improved Patient Specific Functional Score to at least 8.    PLAN  Patient will attend 2 times per week x 6 weeks. Therapy may include, but is not limited to THERAPEUTIC EXERCISES, MYOFASCIAL/JOINT MOBILIZATION, POSTURE/BODY MECHANICS, ERGONOMIC TRAINING, TRANSFER/GAIT TRAINING, HOME INSTRUCTIONS, HEAT/COLD, ULTRASOUND, ELECTRICAL STIMULATION, KINESIOTAPE, MECHANICAL TRACTION, and NEURO RE-EDUCATIOIN     Evaluation complexity:   Personal factors impacting POC: FREQUENT OR CHRONIC PAIN, PRE-EXISTING FUNCTIONAL LIMITATIONS, OCCUPATIONAL ADLS (IE HEAVY LIFTING, REPETITIVE TASKS, LONG HOURS), and acoustic neuroma   Co-morbidities impacting POC: PREVIOUS SURGERIES  Complexity of physical exam: INCLUDING MUSCULOSKELETAL SYSTEM (POSTURE, ROM, STRENGTH, HEIGHT/WEIGHT), INCLUDING NEUROMUSCULAR EXAM (BALANCE, GAIT, LOCOMOTION, MOBILITY), and INCLUDING ACTIVITY/MOBILITY RESTRICTIONS   Clinical Presentation: STABLE   Evaluation Complexity: LOW-HISTORY  0, EXAMINATION 1-2, STABLE PRESENTATION    At next visit: Continue left hip stretching and mobility, left LLE nerve glides and hamstring mobility, continue extension based movements for LLE radicular symptoms.           Intervention minutes: EVALUATION 30 minutes and THERAPEUTIC EXERCISE 8 minutes    Mateo Flow, PT  05/20/2022, 14:01    Start of Service: _________          Certification:    From:______  Through:_________    I certify the need for these services furnished under this plan of treatment and while under my care.    Referring Provider Signature: _______________     Date : _____________________

## 2022-05-21 ENCOUNTER — Other Ambulatory Visit (HOSPITAL_COMMUNITY): Payer: Self-pay | Admitting: PAIN MANAGEMENT

## 2022-05-21 DIAGNOSIS — M5416 Radiculopathy, lumbar region: Secondary | ICD-10-CM

## 2022-05-21 DIAGNOSIS — M5137 Other intervertebral disc degeneration, lumbosacral region: Secondary | ICD-10-CM

## 2022-05-27 ENCOUNTER — Other Ambulatory Visit: Payer: Self-pay

## 2022-05-27 ENCOUNTER — Ambulatory Visit (HOSPITAL_COMMUNITY)
Admission: RE | Admit: 2022-05-27 | Discharge: 2022-05-27 | Disposition: A | Discharge status: Still In Hospital | Payer: BC Managed Care – PPO | Source: Ambulatory Visit | Attending: PAIN MANAGEMENT | Admitting: PAIN MANAGEMENT

## 2022-05-27 NOTE — PT Treatment (Addendum)
[facility]  Outpatient Physical Therapy  [place], [address]  [phone]  [phone]    Physical Therapy Treatment Note    Date: 05/27/2022  Patient's Name: Roy [name]  Date of Birth: Jan 20, 1961            Visit #/POC:2 of 12  Authorization:   POC Signed?: No  POC Ends: 1/30  Next Progress Note Due: 1/19      Evaluating Physical Therapist: Mateo Flow, PT  PT diagnosis/Reason for Referral:  radiculopathy, lumbar region   Next Scheduled Physician Appointment: TBD  Allergies/Contraindications:           Subjective: Patient states that he has a little soreness in his back.States the HEP helped him 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 States his main complaint is numbness in his (L) foot. States that he also has  muscle spasms in his (L) foot. States that the spasms are intermittent. States he did receive a letter approving a MRI but does not have a date.    Objective: Treatment delivered as follows:     Measured ROM: AROM 12/19    Right Left   Flexion 80     Extension 20     Sideglide 25 25     EXERCISE/ACTIVITY NAME REPETITIONS RESISTANCE COMPLETED THIS DOS     Prone on elbows   y   Prone press up 2x5  y   Superman x5  y   HEP Review   y   Sciatic nerve glide supine   y   Piriformis stretch (B)   y   bridging x10  y   LTR with PB x15  y   Patient ed lifting ergonomics   y   Lumbar ext with PB seated   y     Access Code: [email or web address]  URL: [email or web address]  Date: 05/27/2022  Prepared by: Carlton Adam    Patient Education  - Lifting Techniques      Assessment: Noted tightness in (B) piriformis. Patient reports centralization with extension bases therex. Noted he was able to demonstrate proper lifting ergonomics following education. VC and tactile cueing required to engage core.     Goals:  STGs 3 Weeks  Patient will demonstrate improved left hip mobility with (-) FABER test to aid in completion of ADLs.  Patient will be independent  with progressive HEP to maximize gains from PT.  Patient will report max 4 LBP to aid in participation in PT.  Patient will reports 55% or more improvement in left radicular symptoms to aid with work duties     LTGs 6 Weeks  Patient will report abolishment of LLE radicular symptoms to aid in work.  Patient will demonstrate 10 squats/sit <> stands with 10# and good form to aid in independent completion of ADLs/IADLs.  Patient will demonstrate improved functional ability via improved Patient Specific Functional Score to at least 8.  Plan: Continue ther ex and mobility there ex. Initiate hip strengthening.     Total Session Time 35 and Timed code minutes 35  THERAPEUTIC EXERCISE 35 minutes      Carlton Adam, PTA  05/27/2022, 08:46

## 2022-05-29 ENCOUNTER — Inpatient Hospital Stay
Admission: RE | Admit: 2022-05-29 | Discharge: 2022-05-29 | Disposition: A | Discharge status: Home / Self Care | Payer: BC Managed Care – PPO | Source: Ambulatory Visit | Attending: PAIN MANAGEMENT | Admitting: PAIN MANAGEMENT

## 2022-05-29 ENCOUNTER — Other Ambulatory Visit: Payer: Self-pay

## 2022-05-29 DIAGNOSIS — M5416 Radiculopathy, lumbar region: Secondary | ICD-10-CM | POA: Insufficient documentation

## 2022-06-03 ENCOUNTER — Other Ambulatory Visit: Payer: Self-pay

## 2022-06-03 ENCOUNTER — Ambulatory Visit (HOSPITAL_COMMUNITY)
Admission: RE | Admit: 2022-06-03 | Discharge: 2022-06-03 | Disposition: A | Discharge status: Still In Hospital | Payer: BC Managed Care – PPO | Source: Ambulatory Visit | Attending: PAIN MANAGEMENT | Admitting: PAIN MANAGEMENT

## 2022-06-03 NOTE — PT Treatment (Signed)
[facility]  Outpatient Physical Therapy  [place], [address]  [phone]  [phone]    Physical Therapy Treatment Note    Date: 06/03/2022  Patient's Name: Roy [name]  Date of Birth: [birth year]      Visit #/POC:3 of 12  Authorization:   POC Signed?: No  POC Ends: 1/30  Next Progress Note Due: 1/19        Evaluating Physical Therapist: Mateo Flow, PT  PT diagnosis/Reason for Referral:  radiculopathy, lumbar region   Next Scheduled Physician Appointment: TBD  Allergies/Contraindications:               Subjective: Patient states that he had the MRI last week. States that he still has some tingling and numbness in his (L) LE only in the evening. 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  States that he does his HEP when the numbness starts but it does not help. 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     Objective: Treatment delivered as follows:      Measured ROM: AROM 12/19    Right Left   Flexion 80     Extension 20     Sideglide 25 25      EXERCISE/ACTIVITY NAME REPETITIONS RESISTANCE COMPLETED THIS DOS      Prone on elbows     n   Prone press up x10   y   Superman x5   y   HEP Review     y   Sciatic nerve glide supine     y   Piriformis stretch (B)     y   bridging x10   y   LTR with PB x15   y   Patient ed lifting ergonomics     n   Lumbar ext with PB seated     y   Clamshell hooklying  Blue TB y   Seated lumbar ext and posture ed   y         Assessment: Modified shoulder/arm position with superman d/t pain in shoulder blade. . Patient reports of centralization with extension based therex are not consistent due to he reports fluctuating symptoms with increase ankle pain then centralization.  Patient is very focused pain and  numbness in (L) LE     Goals:  STGs 3 Weeks  Patient will demonstrate improved left hip mobility with (-) FABER test to aid in completion of ADLs.  Patient will be independent with progressive  HEP to maximize gains from PT.  Patient will report max 4 LBP to aid in participation in PT.  Patient will reports 55% or more improvement in left radicular symptoms to aid with work duties     LTGs 6 Weeks  Patient will report abolishment of LLE radicular symptoms to aid in work.  Patient will demonstrate 10 squats/sit <> stands with 10# and good form to aid in independent completion of ADLs/IADLs.  Patient will demonstrate improved functional ability via improved Patient Specific Functional Score to at least 8.    Plan: Continue ther ex and mobility ther ex. Assess ROM.        Total Session Time 37 and Timed code minutes 37  THERAPEUTIC EXERCISE 37 minutes      Carlton Adam, PTA  06/03/2022, 08:50

## 2022-06-06 ENCOUNTER — Ambulatory Visit (HOSPITAL_COMMUNITY)
Admission: RE | Admit: 2022-06-06 | Discharge: 2022-06-06 | Disposition: A | Discharge status: Still In Hospital | Payer: BC Managed Care – PPO | Source: Ambulatory Visit | Attending: PAIN MANAGEMENT | Admitting: PAIN MANAGEMENT

## 2022-06-06 ENCOUNTER — Other Ambulatory Visit: Payer: Self-pay

## 2022-06-06 NOTE — Progress Notes (Signed)
Per Dr. Mariah Milling referral to [facility] initiated RN Navigator introduction w/ pt by phone to assess / address  / facilitate consultation and care needs for his Right vestibular schwannoma diagnosis.     RN Navigator spent approx 60 min assessing pt understanding, background, and initial anticipated intent for treatment.    Pt is 61yo, male diagnosed with Right Vestibular Schwannoma. Latest MRI is 04/08/2022. All previous scans will be uploaded into Epic. Last hearing test was several years ago.        Current symptoms include: Patient first started noticing symptoms in 2015 when he had a sudden loss of hearing and his ear stopped up. He saw his family doctor (Dr. Noe Gens). A day or two later he started having severe vertigo with nausea. He was referred to an ENT but this took over a year to get into the ENT. Before seeing the ENT he did present to the local ED for concerns of a stroke. 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 At the time he saw the ENT at [facility], they ordered an MRI which showed a right vestibular schwannoma "about the size of a dime" according to patient. They did another MRI 6-8 months latter at [facility] and there had not been any growth. Around this time he was sure he was going to go through with surgery although all three options of surgery, radiation, and watch and wait were discussed. However, his then ENT moved out of state and there was no other ENT provider in the state to do this procedure. He met with neurosurgery who also stated they would like an ENT provider to help with the surgery but there wasn't one nearby. At this time it was around 2 years since his last MRI. A new MRI was obtained and it still showed no significant growth. He started having trouble in the mornings when waking up. He would have vertigo immediately upon waking and had to get up in order for it to go away. This was concerning to him so he got another MRI this  past November which showed slight growth in the size of the tumor. Along with the hearing loss, the patient also has tinnitus, trouble determining where sounds are coming from and an echoing and muffled noise in the right ear. He does not wear hearing aids. He states his balance is slightly off. He was in [employer] in 2015 when they found out about the tumor. He was on ladders daily. His ENT doctor encouraged him to not be on ladders any longer in case of loss of balance or fall. 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 He can tell he has some balance issues when rushing to put on his shoes and staggers a little. He also notices when he gets tired, his balance gets worse.   He does report some facial numbness under his right eye and that his right eye is a little more droopy and doesn't open as wide as his left that he noticed around the time he went to the ED in 2015 or 2016.   His PMH includes Crohn's disease and anxiety. He believes his anxiety has stemmed from his Crohn's disease and takes Clonazepam as needed.   He has been to cardiology for full workup and everything came back normal. No signs of hypertension or other cardiovascular disease.     RN Navigator spent time discussing / reviewing the  nature of these tumors, potential treatment options, and testing done at [facility] to give pt full recommendation for their tumor.      Pt is aware of appointments set for January 26 with audiology, Dr. Tye Maryland, and Dr. Arlyss Queen.        Offered pt support and encouraged pt to contact Navigator with any additional questions / concerns       RN Navigator to follow up as needed / pt request.    Senaida Ores, BSN, RN   [facility] Nurse Navigator, [facility]   [phone] (F)

## 2022-06-06 NOTE — PT Treatment (Signed)
[facility]  Outpatient Physical Therapy  [place], [address]  [phone]  [phone]    Physical Therapy Treatment Note    Date: 06/06/2022  Patient's Name: Roy [name]  Date of Birth: [birth year]          Visit #/POC:4 of 12  Authorization:   POC Signed?: No  POC Ends: 1/30  Next Progress Note Due: 1/19        Evaluating Physical Therapist: Mateo Flow, PT  PT diagnosis/Reason for Referral:  radiculopathy, lumbar region   Next Scheduled Physician Appointment: TBD  Allergies/Contraindications:               Subjective: Patient states that his doctor called they are going to schedule injections. He stated he also told him to continue PT. 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     Objective: Treatment delivered as follows:      Measured ROM: AROM 1/5    Right Left   Flexion 80     Extension 20     Sideglide 25 25      EXERCISE/ACTIVITY NAME REPETITIONS RESISTANCE COMPLETED THIS DOS      Prone on elbows     n   Prone press up x10   y   Superman x5   n   HEP Review     n   Sciatic nerve glide supine     y   Piriformis stretch (B)  Figure 4 piriformis      y   Bridging SL x10   y   LTR with PB x15   y   Patient ed lifting ergonomics     n   Lumbar ext with PB seated     y   Clamshell hooklying   Blue TB n   Seated lumbar ext and posture ed     y   Lumbar ext machine 2x10 40# y   Hip abd machine 2x10 40# y         Assessment: Patient continues to have inconsistent reports of  of centralization with extension based therex.  Patient continues to report increase pain in the evening when he sits on couch to eat and consistently performs lumbar flexion. Tolerated addition of lumbar therex and hip abd machine. Weight on hip abd modified from 50 to 40 d/t pulling /pain in (B) hip joints. No change in AROM compared to initial eval.      Goals:  STGs 3 Weeks  Patient will demonstrate improved left hip mobility with (-)  FABER test to aid in completion of ADLs.  Patient will be independent with progressive HEP to maximize gains from PT.  Patient will report max 4 LBP to aid in participation in PT.  Patient will reports 55% or more improvement in left radicular symptoms to aid with work duties     LTGs 6 Weeks  Patient will report abolishment of LLE radicular symptoms to aid in work.  Patient will demonstrate 10 squats/sit <> stands with 10# and good form to aid in independent completion of ADLs/IADLs.  Patient will demonstrate improved functional ability via improved Patient Specific Functional Score to at least 8.     Plan: Continue strengthening  and mobility ther ex. Progress as tolerated.               Total Session Time 35 and Timed code minutes 35  THERAPEUTIC EXERCISE 35 minutes  Carlton Adam, PTA  06/06/2022, 09:38

## 2022-06-10 ENCOUNTER — Other Ambulatory Visit: Payer: Self-pay

## 2022-06-10 ENCOUNTER — Ambulatory Visit (HOSPITAL_COMMUNITY)
Admission: RE | Admit: 2022-06-10 | Discharge: 2022-06-10 | Disposition: A | Discharge status: Still In Hospital | Payer: BC Managed Care – PPO | Source: Ambulatory Visit | Attending: PAIN MANAGEMENT | Admitting: PAIN MANAGEMENT

## 2022-06-10 NOTE — PT Treatment (Signed)
[facility]  Outpatient Physical Therapy  [place], [address]  [phone]  [phone]    Physical Therapy Treatment Note    Date: 06/10/2022  Patient's Name: Roy [name]  Date of Birth: [birth year]        Visit #/POC:5 of 12  Authorization:   POC Signed?: No  POC Ends: 1/30  Next Progress Note Due: 1/19        Evaluating Physical Therapist: Mateo Flow, PT  PT diagnosis/Reason for Referral:  radiculopathy, lumbar region   Next Scheduled Physician Appointment: TBD  Allergies/Contraindications:               Subjective: Patient states that he carried 3 gallons of grain to his horses (and had to carry away from body)and this called an increased pain.     Objective: Treatment delivered as follows:      Measured ROM: AROM 1/5    Right Left   Flexion 80     Extension 20     Sideglide 25 25      EXERCISE/ACTIVITY NAME REPETITIONS RESISTANCE COMPLETED THIS DOS      Prone on elbows     n   Prone press up x10   y   Superman x5   n   HEP Review     n   Sciatic nerve glide supine     n   Piriformis stretch (B)  Figure 4 piriformis      n   Bridging SL x10   y   LTR with PB x15   y   Patient ed lifting ergonomics     n   Lumbar ext with PB seated     y   Clamshell hooklying   Blue TB n   Seated lumbar ext and posture ed     n   Lumbar ext machine 2x10 40#  y   Hip abd machine x20 40#  y         Assessment: Noted centralization with extension based therex. Noted no increase in pain with treatment. Noted he continues to decrease lumbar mobility.     Goals:  STGs 3 Weeks  Patient will demonstrate improved left hip mobility with (-) FABER test to aid in completion of ADLs.  Patient will be independent with progressive HEP to maximize gains from PT.  Patient will report max 4 LBP to aid in participation in PT.  Patient will reports 55% or more improvement in left radicular symptoms to aid with work duties     LTGs 6 Weeks  Patient will report abolishment of LLE  radicular symptoms to aid in work.  Patient will demonstrate 10 squats/sit <> stands with 10# and good form to aid in independent completion of ADLs/IADLs.  Patient will demonstrate improved functional ability via improved Patient Specific Functional Score to at least 8.     Plan: Continue strengthening  and mobility ther ex. Progress as tolerated.              Total Session Time 25 and Timed code minutes 25  THERAPEUTIC EXERCISE 25 minutes      Carlton Adam, PTA  06/10/2022, 08:52

## 2022-06-12 ENCOUNTER — Other Ambulatory Visit: Payer: Self-pay

## 2022-06-12 ENCOUNTER — Ambulatory Visit
Admission: RE | Admit: 2022-06-12 | Discharge: 2022-06-12 | Disposition: A | Discharge status: Still In Hospital | Payer: BC Managed Care – PPO | Source: Ambulatory Visit | Attending: PAIN MANAGEMENT | Admitting: PAIN MANAGEMENT

## 2022-06-12 NOTE — PT Treatment (Signed)
[facility]  Outpatient Physical Therapy  [place], [address]  [phone]  [phone]    Physical Therapy Treatment Note    Date: 06/12/2022  Patient's Name: Roy [name]  Date of Birth: September 07, 1960      Visit #/POC: 6 of 12  Authorization:   POC Signed?: No  POC Ends: 1/30  Next Progress Note Due: 1/19        Evaluating Physical Therapist: Mateo Flow, PT  PT diagnosis/Reason for Referral:  radiculopathy, lumbar region   Next Scheduled Physician Appointment: TBD  Allergies/Contraindications:               Subjective: Patient reports overall less paresthesias down the left leg.  States yesterday he went to the dentist yesterday and the low back muscles tensed up.  Today, he relates mild numbness in the bottom of the left foot.  Patient states he notices the worse symptoms when he sits down in the evening and stops moving.  Patient states he has to cut firewood today for the cold weather that is coming.     Objective: Treatment delivered as follows:      Measured ROM: AROM 1/5    Right Left   Flexion 80     Extension 20     Sideglide 25 25      EXERCISE/ACTIVITY NAME REPETITIONS RESISTANCE COMPLETED THIS DOS      Prone on elbows     n   Prone press up x10   y   Superman x5   n   HEP Review     n   Sciatic nerve glide supine (popliteal angle)  Sciatic nerve glide supine SLR  20x    20x   Y     Piriformis stretch (B)  Figure 4 piriformis      n   Bridging SL x10   y   LTR with PB x15   y   Patient ed lifting ergonomics     n   Lumbar ext with PB seated     y   Clamshell hooklying   Blue TB n   Seated lumbar ext and posture ed     n   Lumbar ext machine 2x10 40#  y   Hip abd machine x20 40#  y   Incline Stretch 60"  Y   Joint mobilizations:    Central lumbar mobilizations   Grade 3    Y         Assessment: Continues to tolerate extension based exercises well with centralization of symptoms.  Exhibits decreased lumbar extension mobility and decreased PA  lumbar segmental glides.  Advised sciatic nerve glide at the end of the day and standing back extension intermittently during work activities during the day.     Goals:  STGs 3 Weeks  Patient will demonstrate improved left hip mobility with (-) FABER test to aid in completion of ADLs.  Patient will be independent with progressive HEP to maximize gains from PT.  Patient will report max 4 LBP to aid in participation in PT.  Patient will reports 55% or more improvement in left radicular symptoms to aid with work duties     LTGs 6 Weeks  Patient will report abolishment of LLE radicular symptoms to aid in work.  Patient will demonstrate 10 squats/sit <> stands with 10# and good form to aid in independent completion of ADLs/IADLs.  Patient will demonstrate improved functional ability via improved Patient  Specific Functional Score to at least 8.     Plan: Continue POC based on response.    Total Session Time 43 and Timed code minutes 43  THERAPEUTIC EXERCISE 30 minutes and JOINT MOBILIZATION/MFR 13 minutes      Kinnedy Mongiello, PT  06/12/2022, 08:00

## 2022-06-17 ENCOUNTER — Ambulatory Visit (HOSPITAL_COMMUNITY): Payer: Self-pay

## 2022-06-20 ENCOUNTER — Ambulatory Visit (HOSPITAL_COMMUNITY)
Admission: RE | Admit: 2022-06-20 | Discharge: 2022-06-20 | Disposition: A | Discharge status: Still In Hospital | Payer: BC Managed Care – PPO | Source: Ambulatory Visit | Attending: PAIN MANAGEMENT | Admitting: PAIN MANAGEMENT

## 2022-06-20 ENCOUNTER — Other Ambulatory Visit: Payer: Self-pay

## 2022-06-20 DIAGNOSIS — M5137 Other intervertebral disc degeneration, lumbosacral region: Secondary | ICD-10-CM | POA: Insufficient documentation

## 2022-06-20 DIAGNOSIS — M5416 Radiculopathy, lumbar region: Secondary | ICD-10-CM | POA: Insufficient documentation

## 2022-06-20 NOTE — PT Treatment (Signed)
[facility]  Outpatient Physical Therapy  [place], [address]  [phone]  [phone]    Physical Therapy Treatment Note    Date: 06/20/2022  Patient's Name: Roy [name]  Date of Birth: 1960-11-26      Visit #/POC: 7 of 12  Authorization:   POC Signed?: No  POC Ends: 1/30  Next Progress Note Due: 1/19        Evaluating Physical Therapist: Mateo Flow, PT  PT diagnosis/Reason for Referral:  radiculopathy, lumbar region   Next Scheduled Physician Appointment: TBD  Allergies/Contraindications:               Subjective: Patient reports he feels something in the low back that feels like it needs to pop.  Relates he fell asleep in the recliner.  Patient reports he had to go back to the dentist again which continued to tense up the low back muscles.  Currently, no radicular symptoms down the left leg, but patient states radicular symptoms usually occur in the evening.     Objective: Treatment delivered as follows:      Measured ROM: AROM 1/5    Right Left   Flexion 80     Extension 20     Sideglide 25 25      EXERCISE/ACTIVITY NAME REPETITIONS RESISTANCE COMPLETED THIS DOS      Prone on elbows     Y   Prone press up x10   y   Superman x5   n   HEP Review     n   Sciatic nerve glide supine (popliteal angle)  Sciatic nerve glide supine SLR  20x     20x   N      Piriformis stretch (B)  Figure 4 piriformis      n   Bridging SL x10   N   LTR with PB x15   N   Patient ed lifting ergonomics     n   Lumbar ext with PB seated     y   Clamshell hooklying   Blue TB n   Seated lumbar ext and posture ed     n   Lumbar ext machine 2x10 40#  N   Hip abd machine x20 40#  N   Incline Stretch 60"   N   Joint mobilizations:    Central lumbar mobilizations    Grade 3 and Grade 4      Y         Assessment: Emphasized joint mobilizations to the thoracic and lumbar segments today.  Cavitations occurred in the mid thoracic spine with reported relief in tension in the mid back.   The lumbar region responded well to PA grade 3 and 4 joint mobilizations with improved mobility in the anterior direction at the end of the session.     Goals:  STGs 3 Weeks  Patient will demonstrate improved left hip mobility with (-) FABER test to aid in completion of ADLs.  Patient will be independent with progressive HEP to maximize gains from PT.  Patient will report max 4 LBP to aid in participation in PT.  Patient will reports 55% or more improvement in left radicular symptoms to aid with work duties     LTGs 6 Weeks  Patient will report abolishment of LLE radicular symptoms to aid in work.  Patient will demonstrate 10 squats/sit <> stands with 10# and good form to aid in independent completion of ADLs/IADLs.  Patient will demonstrate improved functional ability via improved Patient Specific Functional Score to at least 8.     Plan: Continue POC based on response.    Total Session Time 35, Timed code minutes 25, and Untimed code minutes 10  JOINT MOBILIZATION/MFR 25 minutes, 10 minutes MHP      Bayne Fosnaugh, PT  06/20/2022, 08:41

## 2022-06-24 ENCOUNTER — Other Ambulatory Visit: Payer: Self-pay

## 2022-06-24 ENCOUNTER — Ambulatory Visit (HOSPITAL_COMMUNITY)
Admission: RE | Admit: 2022-06-24 | Discharge: 2022-06-24 | Disposition: A | Discharge status: Still In Hospital | Payer: BC Managed Care – PPO | Source: Ambulatory Visit | Attending: PAIN MANAGEMENT | Admitting: PAIN MANAGEMENT

## 2022-06-24 NOTE — PT Treatment (Signed)
[facility]  Outpatient Physical Therapy  [place], [address]  [phone]  [phone]    Physical Therapy Treatment Note    Date: 06/24/2022  Patient's Name: Roy [name]  Date of Birth: [birth year]      Visit #/POC: 8 of 12  Authorization:   POC Signed?: No  POC Ends: 1/30  Next Progress Note Due: 1/19        Evaluating Physical Therapist: Mateo Flow, PT  PT diagnosis/Reason for Referral:  radiculopathy, lumbar region   Next Scheduled Physician Appointment: TBD  Allergies/Contraindications:              Subjective: Patient relates yesterday while sitting on couch watching TV he experienced increased LLE radicular symptoms and muscle spasms and it almost felt like like it was going to go to the right extremity as well.  He relates he he stood up and tried the extension exercises but they did not help.  This morning, he feels coldness and tingling just in the left foot.  Patient relates 2 days ago he did shovel snow but not a whole lot.  Patient currently reports 5/10 (but last night would be 10/10) in the foot.       Objective: Treatment delivered as follows:      Measured ROM: AROM 1/5    Right Left   Flexion 80     Extension 20     Sideglide 25 25      EXERCISE/ACTIVITY NAME REPETITIONS RESISTANCE COMPLETED THIS DOS      Prone on elbows     Y   Prone press up x10   y   Superman x5   n   HEP Review     n   Sciatic nerve glide supine (popliteal angle)  Sciatic nerve glide supine SLR  20x     20x   Y      Piriformis stretch (B)  Figure 4 piriformis      Y   Bridging 2x12   Y   LTR with PB x15   N   Patient ed lifting ergonomics     n   Lumbar ext with PB seated     N   Clamshell hooklying   Blue TB n   Seated lumbar ext and posture ed     n   Lumbar ext machine 2x10 40#  N   Hip abd machine x20 40#  N   Incline Stretch 60"   N   Joint mobilizations:    Central lumbar mobilizations    Grade 3 and Grade 4      Y   Incline stretch  Hip flexor stretch  at step (LLE) 2x30"  2x30"  Y         Assessment: Abolishment of left foot paresthesias following central lumbar PA mobilizations and prone press ups.  Responds very well to joint mobilizations to address hypomobility throughout the thoracic and lumbar spine.  Re-initiated piriformis, hamstring, and gastroc stretching to improve neural mobility at the LLE.  Tolerated all exercises well without a reported increase in radicular pain.     Goals:  STGs 3 Weeks  Patient will demonstrate improved left hip mobility with (-) FABER test to aid in completion of ADLs.  Patient will be independent with progressive HEP to maximize gains from PT.  Patient will report max 4 LBP to aid in participation in PT.  Patient will reports 55% or more improvement in  left radicular symptoms to aid with work duties     LTGs 6 Weeks  Patient will report abolishment of LLE radicular symptoms to aid in work.  Patient will demonstrate 10 squats/sit <> stands with 10# and good form to aid in independent completion of ADLs/IADLs.  Patient will demonstrate improved functional ability via improved Patient Specific Functional Score to at least 8.     Plan: Continue 2x a week for 4 more weeks to address remaining deficits related to lumbar hypomobility and lumbar radiculopathy.    Total Session Time 44 and Timed code minutes 44  THERAPEUTIC EXERCISE 30 minutes and JOINT MOBILIZATION/MFR 14 minutes    Start of Service:     Re-certification:           From:    Through:       Treatment Diagnosis:           I have reviewed this plan of treatment and re-certify a continuing need for service.      Referring Provider Signature:       Date:        Printed Name of Referring Provider: __________________________________________     Mateo Flow, PT  06/24/2022, 08:47

## 2022-06-26 ENCOUNTER — Other Ambulatory Visit: Payer: Self-pay

## 2022-06-26 ENCOUNTER — Ambulatory Visit (HOSPITAL_COMMUNITY): Payer: Self-pay

## 2022-06-26 ENCOUNTER — Ambulatory Visit
Admission: RE | Admit: 2022-06-26 | Discharge: 2022-06-26 | Disposition: A | Discharge status: Still In Hospital | Payer: BC Managed Care – PPO | Source: Ambulatory Visit | Attending: PAIN MANAGEMENT | Admitting: PAIN MANAGEMENT

## 2022-06-26 NOTE — PT Treatment (Addendum)
[facility]  Outpatient Physical Therapy  [place], [address]  [phone]  [phone]    Physical Therapy Treatment Note    Date: 06/26/2022  Patient's Name: Roy [name]  Date of Birth: Jan 26, 1961      Visit #/POC: 9 of 12  Authorization:   POC Signed?: No  POC Ends: 1/30  Next Progress Note Due: 1/19        Evaluating Physical Therapist: Mateo Flow, PT  PT diagnosis/Reason for Referral:  radiculopathy, lumbar region   Next Scheduled Physician Appointment: TBD  Allergies/Contraindications:          DISCHARGE NOTE  Patient has not followed up with PT and will be discharged at this time.  No progress toward goals was made.  He attended  9 sessions of the 12 total POC with 1 cancels and 0 no-shows.  Interventions included therex and joint mobilizations for the treatment lumbar radiculopathy.  Date of service: 05/20/22 - 06/26/22.       Subjective: Patient states that when  he is sitting on couch watching TV had increased LLE radicular symptoms and muscle spasms. States that he went to his PCP and she told him that even though the stretches may feel like they are irritating his nerve it may take time and help in the long run. States she is going to refer him to [facility].     Objective: Treatment delivered as follows:      Measured ROM: AROM 1/5    Right Left   Flexion 80     Extension 20     Sideglide 25 25      EXERCISE/ACTIVITY NAME REPETITIONS RESISTANCE COMPLETED THIS DOS      Prone on elbows     Y   Prone press up x10   y   Superman x5   n   HEP Review     n   Sciatic nerve glide supine (popliteal angle)  Sciatic nerve glide supine SLR  20x     20x   Y      Piriformis stretch (B)  Figure 4 piriformis      Y   Bridging 2x12   Y   LTR with PB x15   N   Patient ed lifting ergonomics     n   Lumbar ext with PB seated     N   Clamshell hooklying   Blue TB n   Seated lumbar ext and posture ed     n   Lumbar ext machine 2x10 40#  N   Hip abd machine  x20 40#  N   Incline Stretch 60"   N   Joint mobilizations:    Central lumbar mobilizations    Thoracic mobilization    Grade 3 and Grade 4      Y  y   Incline stretch  Hip flexor stretch at step (LLE) 2x30"  2x30"   Y   Sacral floats    y         Assessment:  Continues to report abolishment of left foot paresthesias following central lumbar PA mobilizations and prone on elbows. Improved lumbar extension with sacral. Noted  hypomobility throughout the thoracic and lumbar spine. Noted tightness  in  piriformis and  hamstring.      Goal   STGs 3 Weeks  Patient will demonstrate improved left hip mobility with (-) FABER test to aid in completion of ADLs.  Patient  will be independent with progressive HEP to maximize gains from PT.  Patient will report max 4 LBP to aid in participation in PT.  Patient will reports 55% or more improvement in left radicular symptoms to aid with work duties     LTGs 6 Weeks  Patient will report abolishment of LLE radicular symptoms to aid in work.  Patient will demonstrate 10 squats/sit <> stands with 10# and good form to aid in independent completion of ADLs/IADLs.  Patient will demonstrate improved functional ability via improved Patient Specific Functional Score to at least 8.     Plan: Continue POC based to patients response.       Total Session Time 35 and Timed code minutes 35  THERAPEUTIC EXERCISE 22 minutes and JOINT MOBILIZATION/MFR 13 minutes      Carlton Adam, PTA  06/26/2022, 08:39

## 2022-06-27 ENCOUNTER — Ambulatory Visit (HOSPITAL_COMMUNITY): Payer: Self-pay

## 2022-07-01 ENCOUNTER — Ambulatory Visit (HOSPITAL_COMMUNITY): Payer: Self-pay

## 2022-07-03 ENCOUNTER — Other Ambulatory Visit (HOSPITAL_COMMUNITY): Payer: Self-pay

## 2022-07-03 DIAGNOSIS — D333 Benign neoplasm of cranial nerves: Secondary | ICD-10-CM

## 2022-07-07 ENCOUNTER — Encounter (HOSPITAL_COMMUNITY): Payer: Self-pay

## 2022-10-24 ENCOUNTER — Inpatient Hospital Stay
Admission: RE | Admit: 2022-10-24 | Discharge: 2022-10-24 | Disposition: A | Discharge status: Home / Self Care | Payer: BC Managed Care – PPO | Source: Ambulatory Visit | Attending: Family Medicine | Admitting: Family Medicine

## 2022-10-24 ENCOUNTER — Other Ambulatory Visit: Payer: Self-pay

## 2022-10-24 ENCOUNTER — Other Ambulatory Visit (HOSPITAL_COMMUNITY): Payer: Self-pay | Admitting: Family Medicine

## 2022-10-24 DIAGNOSIS — M79671 Pain in right foot: Secondary | ICD-10-CM

## 2022-10-24 DIAGNOSIS — R52 Pain, unspecified: Secondary | ICD-10-CM

## 2023-04-06 ENCOUNTER — Other Ambulatory Visit (HOSPITAL_COMMUNITY): Payer: Self-pay

## 2023-04-06 DIAGNOSIS — D333 Benign neoplasm of cranial nerves: Secondary | ICD-10-CM

## 2023-04-14 ENCOUNTER — Encounter (HOSPITAL_COMMUNITY): Payer: Self-pay

## 2023-04-29 IMAGING — MR MRI  BRAIN & IACS W/WO
12 of 13 series · 41 of 48 positions shown · IV contrast (gadavist)
Comparison: MRI brain and internal auditory canal dated 04/08/2022.

﻿EXAM:  [id number]   MRI  BRAIN & IACS W/WO
INDICATION: 61-year-old with history of acoustic neuroma. Follow-up.  No new symptoms. No history of surgery.
TECHNIQUE: Axial, coronal and sagittal views were obtained including thin section high-resolution images of cerebellopontine angles and internal auditory canals and including postcontrast examination after injection of 5 mL Gadavist IV.

[Series 5: DWI · axial · 6.0mm · 1.35mm/px · z∈[-72,+89]mm · 12 of 96 slices shown (1 of 3)]
[im 1/96]
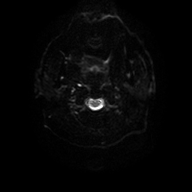
[im 9/96]
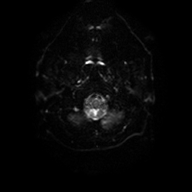
[im 18/96]
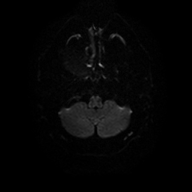
[im 26/96]
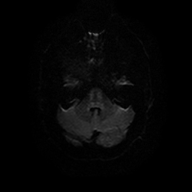
[im 35/96]
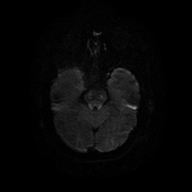
[im 44/96]
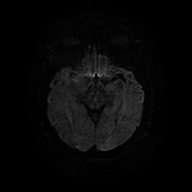
[im 52/96]
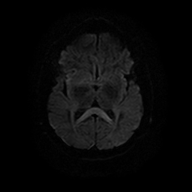
[im 61/96]
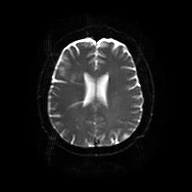
[im 70/96]
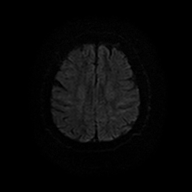
[im 78/96]
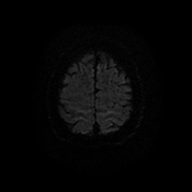
[im 87/96]
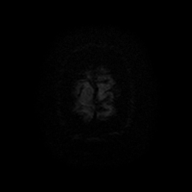
[im 96/96]
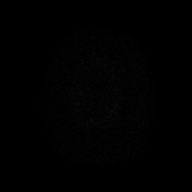

[Series 6: DWI · axial · 6.0mm · 1.35mm/px · z∈[-72,+89]mm · 3 of 24 slices shown (2 of 3)]
[im 1/24]
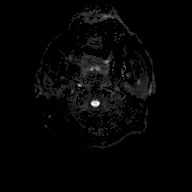
[im 12/24]
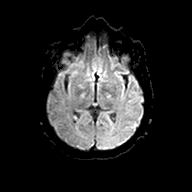
[im 24/24]
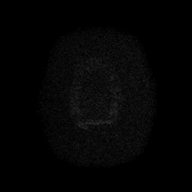

[Series 7: DWI · axial · 6.0mm · 1.35mm/px · z∈[-72,+89]mm · 3 of 23 slices shown (3 of 3)]
[im 1/23]
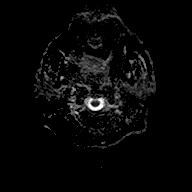
[im 12/23]
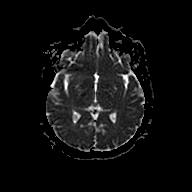
[im 23/23]
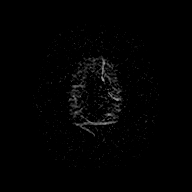

[Series 8: FLAIR · sagittal · 4.5mm · 0.75mm/px · 3 of 27 slices shown (1 of 2)]
[im 1/27]
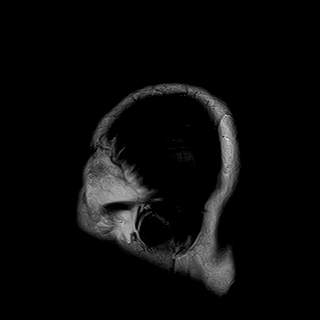
[im 14/27]
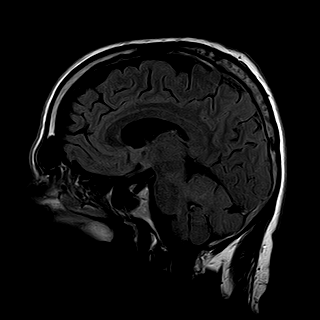
[im 27/27]
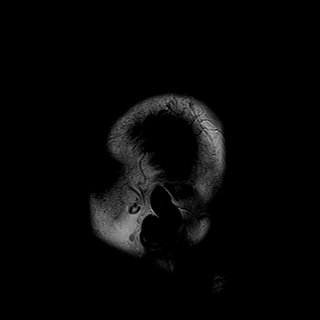

[Series 9: FLAIR · axial · 6.0mm · 0.76mm/px · z∈[-68,+93]mm · 3 of 24 slices shown (2 of 2)]
[im 1/24]
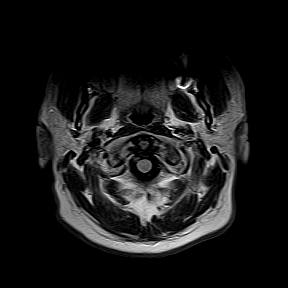
[im 12/24]
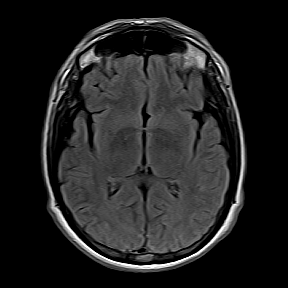
[im 24/24]
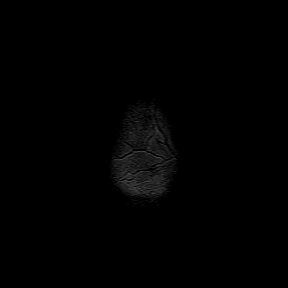

[Series 10: T1 · axial · 6.0mm · 0.69mm/px · z∈[-68,+93]mm · 3 of 24 slices shown (1 of 2)]
[im 1/24]
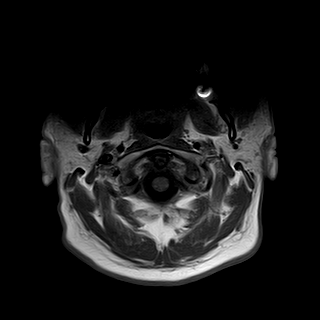
[im 12/24]
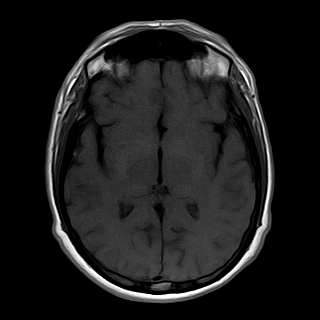
[im 24/24]
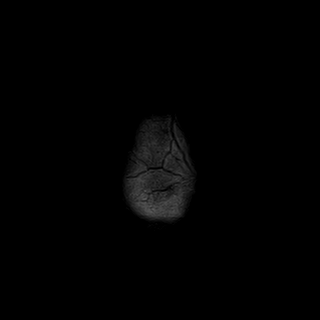

[Series 11: T2-star · axial · 6.0mm · 0.69mm/px · z∈[-68,+93]mm · 3 of 24 slices shown]
[im 1/24]
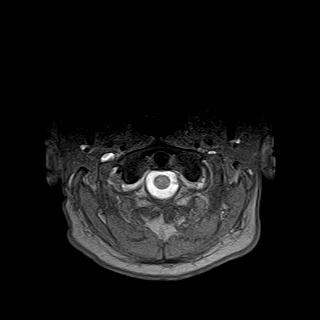
[im 12/24]
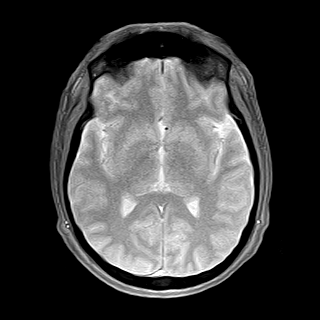
[im 24/24]
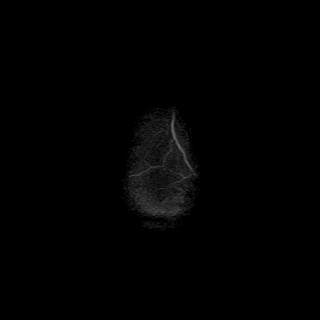

[Series 12: T2 · axial · 4.5mm · 0.43mm/px · z∈[-66,+92]mm · 4 of 36 slices shown]
[im 1/36]
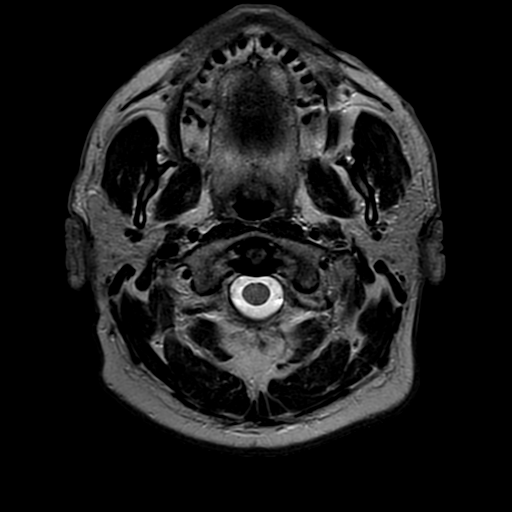
[im 12/36]
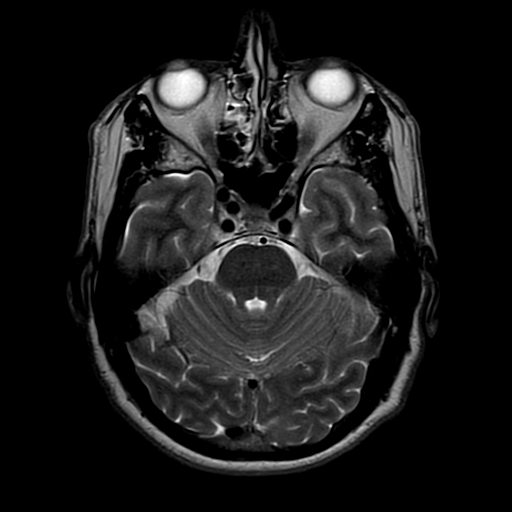
[im 24/36]
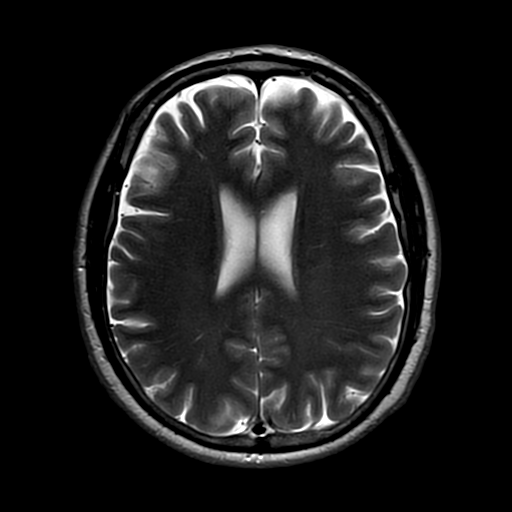
[im 36/36]
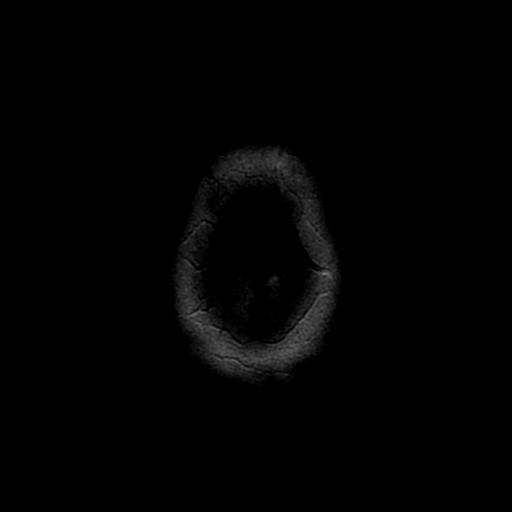

[Series 15: T1 · axial · 3.0mm · 0.47mm/px · 1 of 11 slices shown (2 of 2)]
[im 1/11]
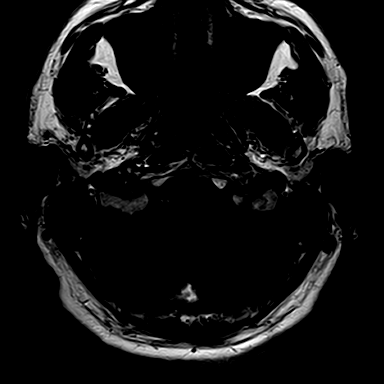

[Series 16: T1 post-contrast · axial · 3.0mm · 0.47mm/px · 1 of 11 slices shown (1 of 3)]
[im 1/11]
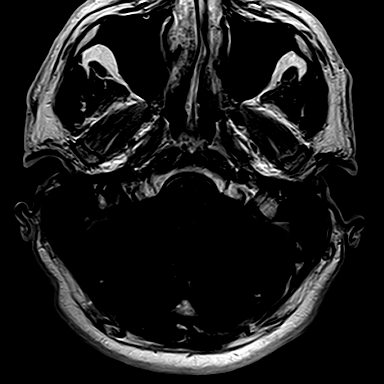

[Series 17: T1 post-contrast · axial · 5.0mm · 0.43mm/px · z∈[-77,+93]mm · 4 of 35 slices shown (2 of 3)]
[im 1/35]
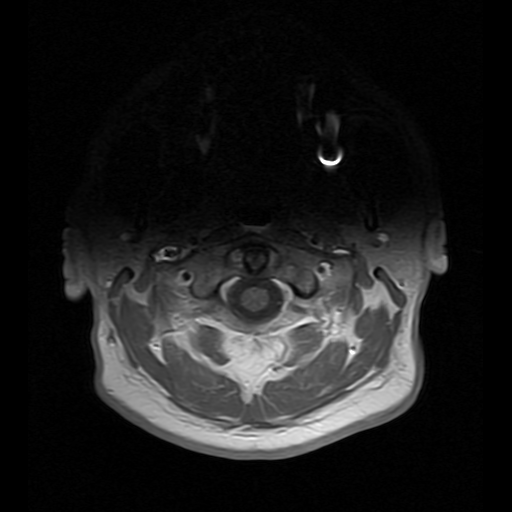
[im 12/35]
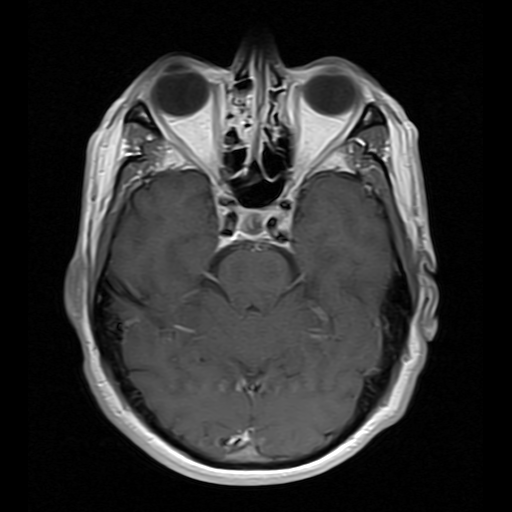
[im 23/35]
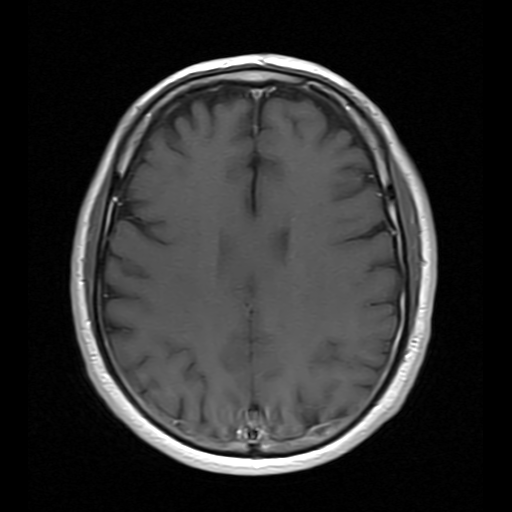
[im 35/35]
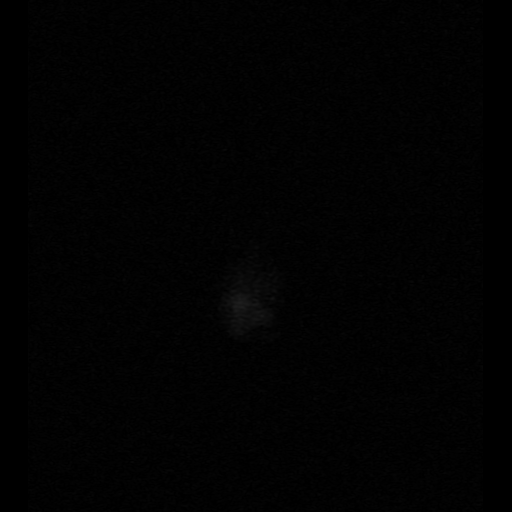

[Series 18: T1 post-contrast · coronal · 3.0mm · 0.47mm/px · 1 of 11 slices shown (3 of 3)]
[im 1/11]
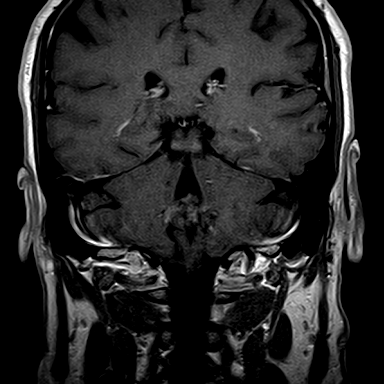

[41 of 48 positions shown; findings below may reference images not displayed]

FINDINGS: No focal areas of restricted diffusion.  No intracranial bleed or extra-axial collections. No evidence of ventriculomegaly or midline shift. 

Examination of the right cerebellopontine angle and internal auditory canal redemonstrates well-circumscribed, lobulated mass with partial intracanalicular and partly extracanalicular location. Overall dimensions of the lesion measure 15 x 10 x 10 mm, unchanged from 04/08/2022.  Uniform enhancement is noted on postcontrast study.  No other intracranial lesions are seen.
IMPRESSION: Stable findings of the enhancing mass at the right cerebellopontine angle as described above measuring 15 x 10 x 10 mm in size. Findings are unchanged from 04/08/2022.

Electronically Signed by CEOLA, FELNER at [id number] [DATE]

## 2023-05-05 IMAGING — US US ABDOMEN COMPLETE
1 series · 14 of 25 positions shown · non-contrast
Comparison: Ultrasound exam dated 06/21/2018.

﻿EXAM: US ABDOMEN COMPLETE
INDICATION: Fatty liver.

[Series 1: us abdomen complete · 14 of 64 slices shown]
[im 1/64]
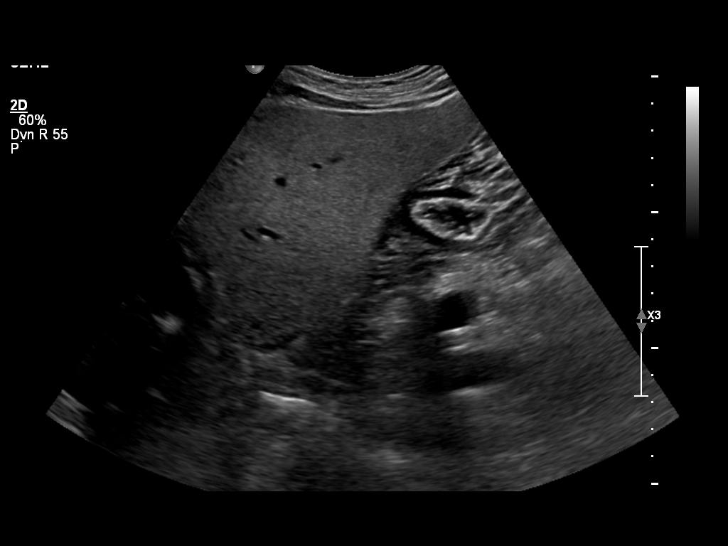
[im 6/64]
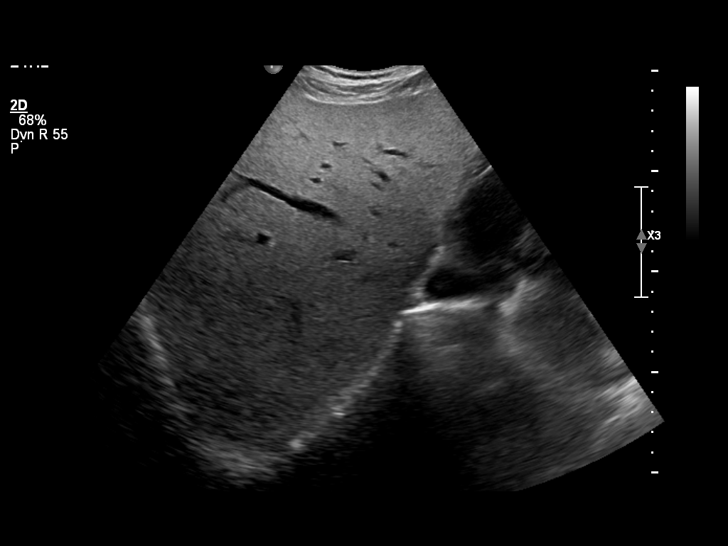
[im 11/64]
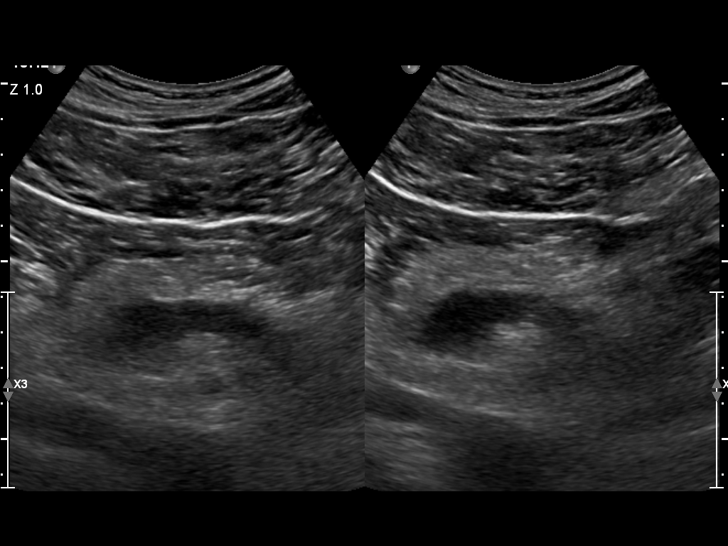
[im 16/64]
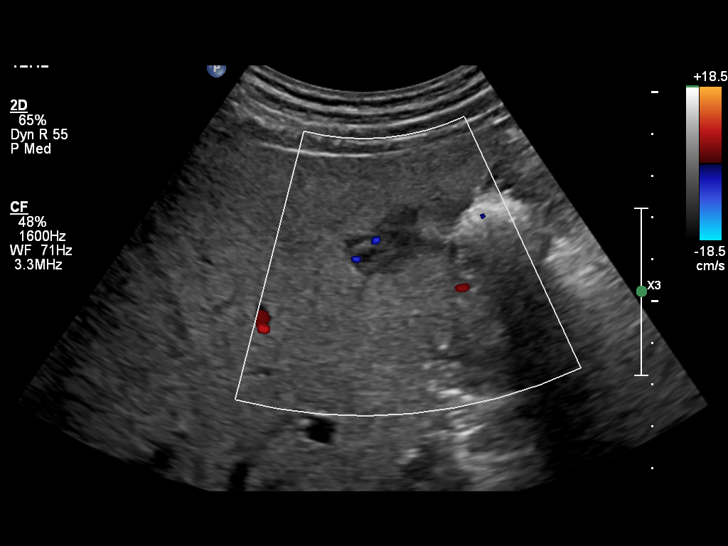
[im 22/64]
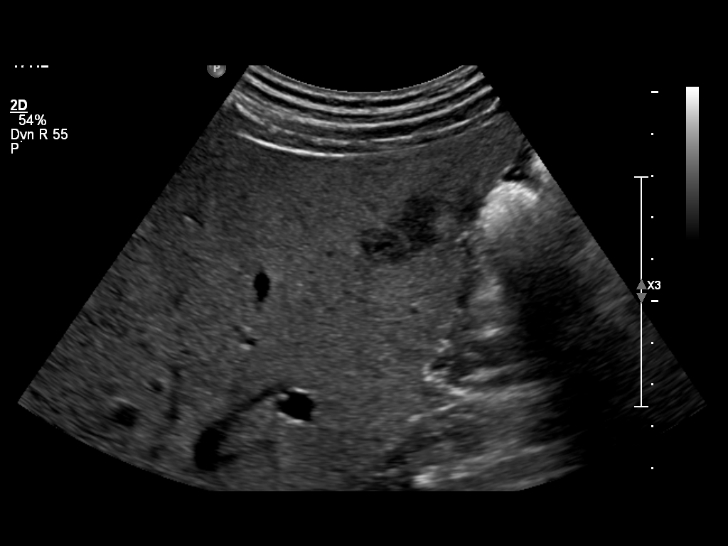
[im 24/64]
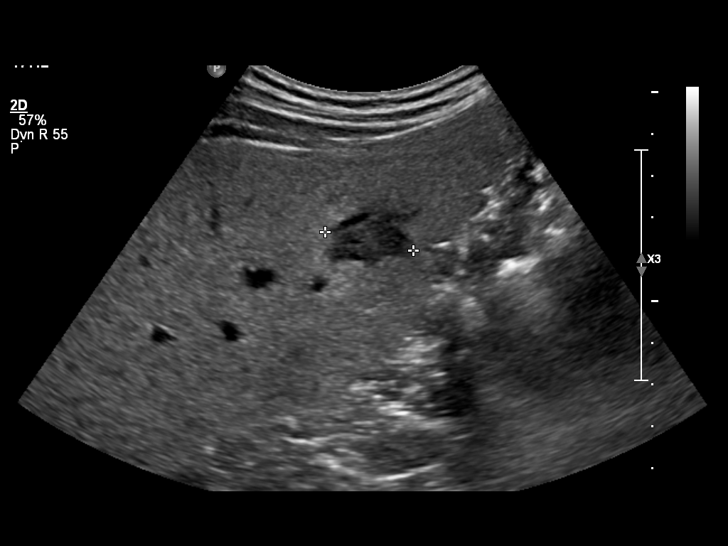
[im 29/64]
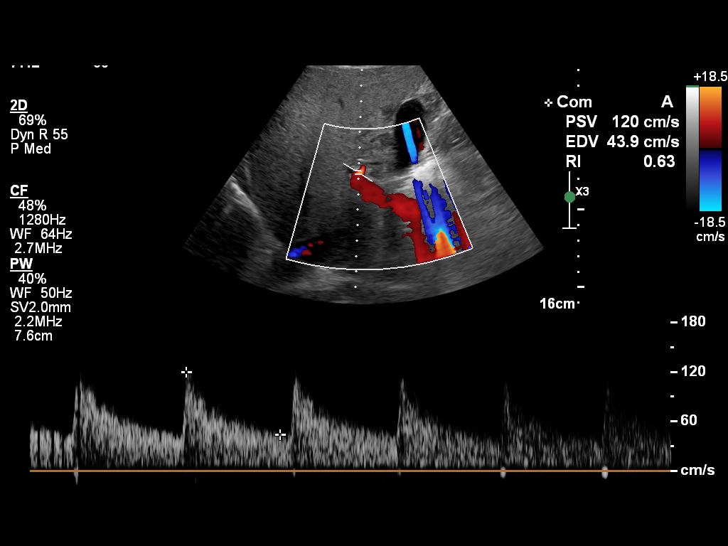
[im 35/64]
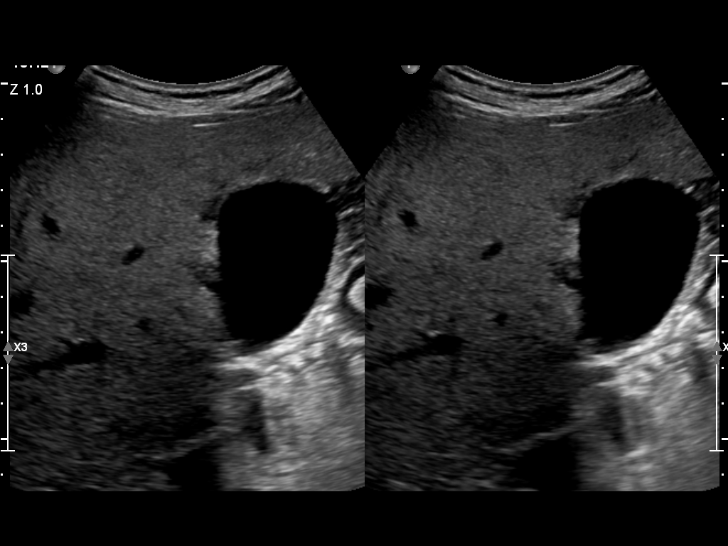
[im 40/64]
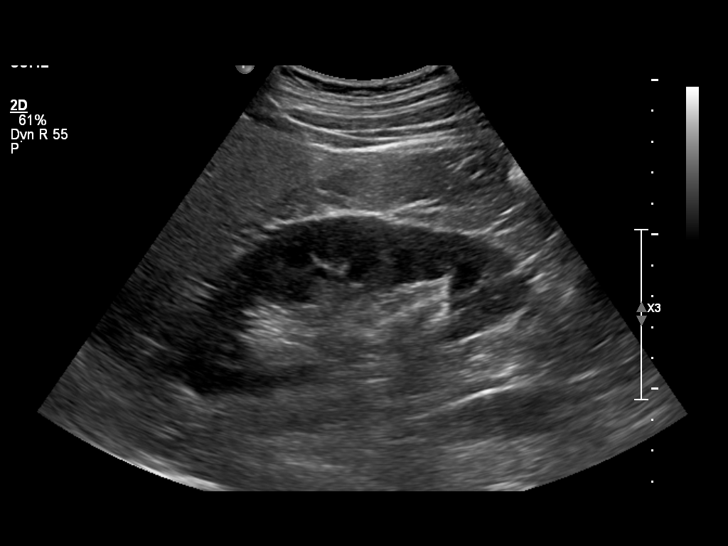
[im 43/64]
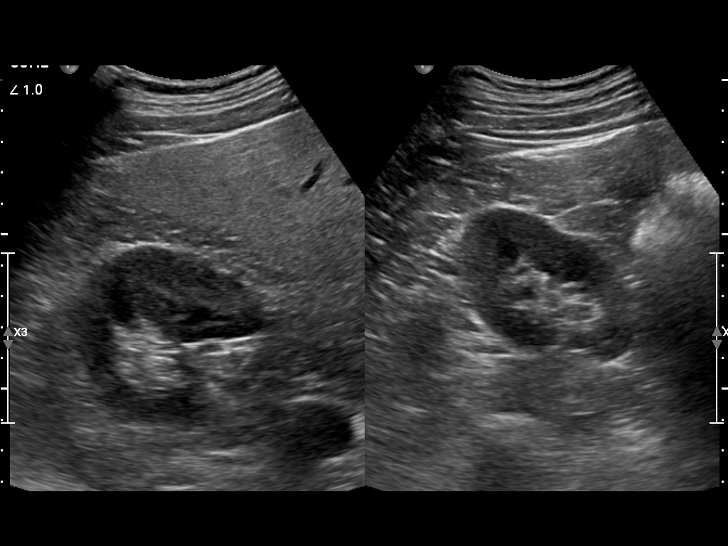
[im 48/64]
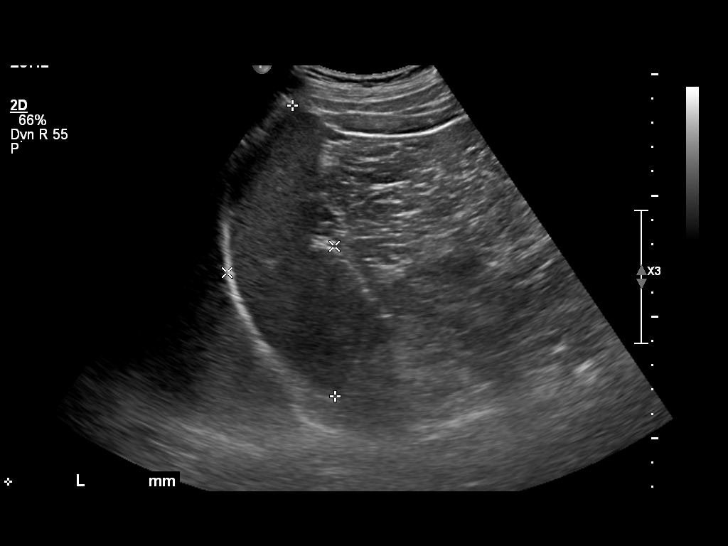
[im 53/64]
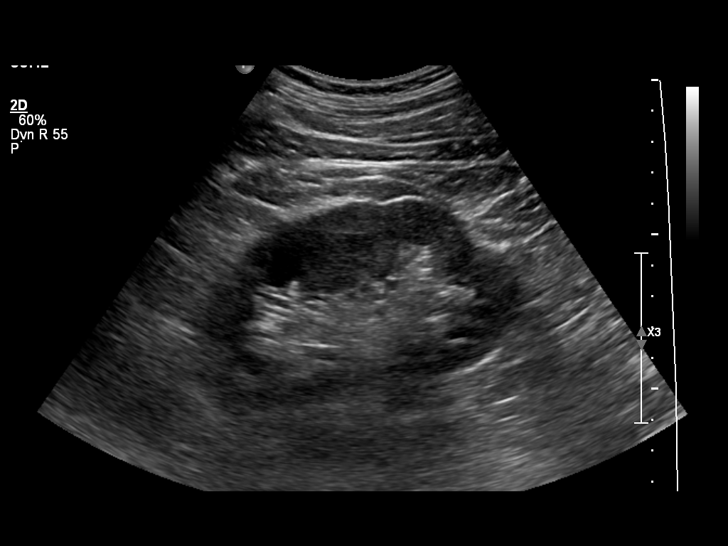
[im 58/64]
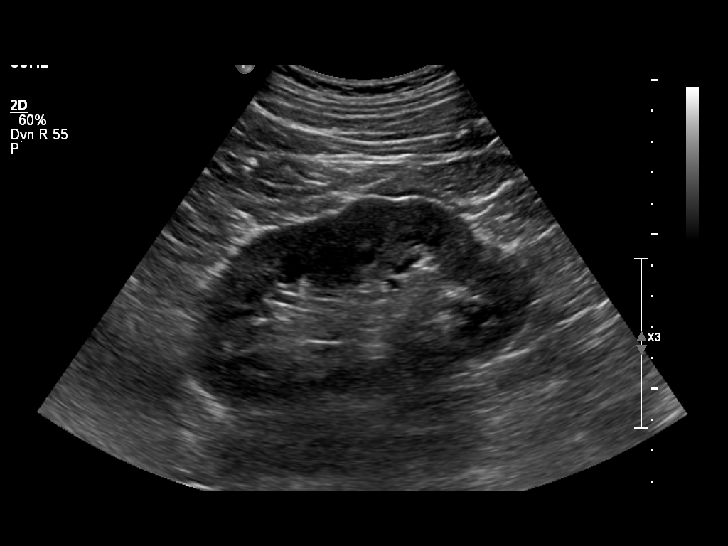
[im 64/64]
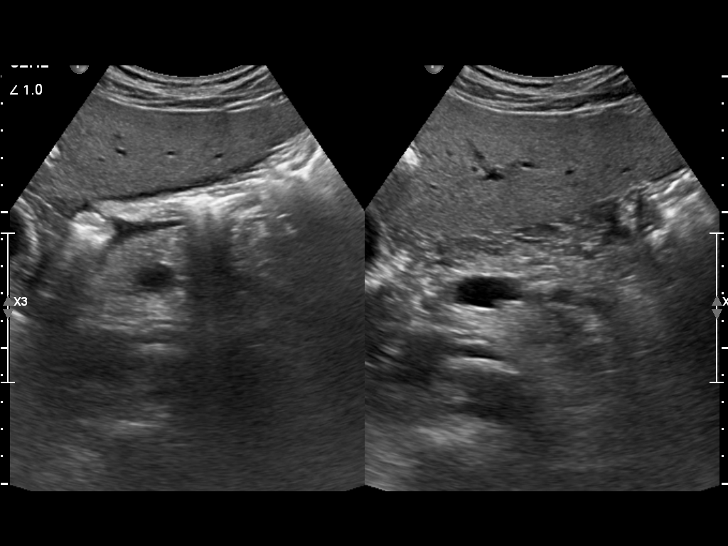

[14 of 25 positions shown; findings below may reference images not displayed]

FINDINGS: No evidence of gallstones. Mild thickening of the wall of the gallbladder. Bile duct measures 5 mm in diameter. 

Normal size liver 15 cm in length with fatty changes with 2 cm area of fat sparing involving right lobe similar to prior study.  Borderline size spleen measuring 12 cm in length. Aorta, vena cava and kidneys are normal. Pancreas is poorly visualized.  No free fluid.
IMPRESSION: 1. No evidence of gallstones. Mild thickening of the wall of the gallbladder. Bile ducts are normal in size.

2. Normal size fatty liver with stable hypodense area of fat sparing.  Borderline size spleen. No free fluid.

3. Pancreas incompletely visualized due to artifact from overlying bowel gas.

## 2023-05-21 ENCOUNTER — Encounter (INDEPENDENT_AMBULATORY_CARE_PROVIDER_SITE_OTHER): Payer: Self-pay | Admitting: Student in an Organized Health Care Education/Training Program

## 2023-09-04 ENCOUNTER — Encounter (INDEPENDENT_AMBULATORY_CARE_PROVIDER_SITE_OTHER): Payer: BC Managed Care – PPO | Admitting: Physician Assistant

## 2023-10-12 NOTE — ED Notes (Signed)
Pt. States understanding of dc orders, f/u care, and medication administration instructions. Pt. Given opportunity for questions and answers.  Pt. In NAD ambulated out of ED without difficulty.

## 2023-10-12 NOTE — ED Triage Notes (Addendum)
 Chest tightness and shortness of breath that started last night and has not went away. Chest pain every day. Cardiology appointment on June 3rd however feels he cannot wait now. Patient states he may have afib. Patient states he wanted to go to [facility] however felt too dizzy to drive there.

## 2023-10-12 NOTE — ED Notes (Signed)
 Chest pain Note:    Complaint of chest pain onset being  yesterday about lunch time   Pain is described as constant pressure and tightness and rated as pain is perceived as severe (6-8 pain scale)  Location of Chest pain is mid chest radiating to abdomen and up into his head    Activity at onset include house work      Signs and symptoms include.  Shortness of Breath: (Yes)  Nausea, Vomiting: (Yes - nausea no vomiting)   Diaphoresis: (No)   Lightheadedness: (Yes)  Weakness: (Yes)  Fatigue: (Yes)   Changes in Mental Status: (No)   Cough , (No)   Past Cardiac Surgery: (No)   Skin Color Within Normal Limits: (Yes)    History reviewed. No pertinent past medical history.

## 2023-10-14 ENCOUNTER — Other Ambulatory Visit: Payer: Self-pay

## 2023-10-14 ENCOUNTER — Inpatient Hospital Stay
Admission: EM | Admit: 2023-10-14 | Discharge: 2023-10-15 | DRG: 309 | Disposition: A | Discharge status: Home / Self Care | Attending: Internal Medicine | Admitting: Internal Medicine

## 2023-10-14 ENCOUNTER — Ambulatory Visit (HOSPITAL_COMMUNITY)

## 2023-10-14 DIAGNOSIS — R0609 Other forms of dyspnea: Secondary | ICD-10-CM

## 2023-10-14 DIAGNOSIS — I480 Paroxysmal atrial fibrillation: Principal | ICD-10-CM | POA: Diagnosis present

## 2023-10-14 DIAGNOSIS — R0600 Dyspnea, unspecified: Secondary | ICD-10-CM

## 2023-10-14 DIAGNOSIS — I499 Cardiac arrhythmia, unspecified: Secondary | ICD-10-CM

## 2023-10-14 DIAGNOSIS — I4891 Unspecified atrial fibrillation: Secondary | ICD-10-CM | POA: Diagnosis present

## 2023-10-14 DIAGNOSIS — R55 Syncope and collapse: Secondary | ICD-10-CM

## 2023-10-14 DIAGNOSIS — I498 Other specified cardiac arrhythmias: Secondary | ICD-10-CM | POA: Insufficient documentation

## 2023-10-14 DIAGNOSIS — Z79899 Other long term (current) drug therapy: Secondary | ICD-10-CM

## 2023-10-14 DIAGNOSIS — R008 Other abnormalities of heart beat: Secondary | ICD-10-CM | POA: Diagnosis present

## 2023-10-14 DIAGNOSIS — R079 Chest pain, unspecified: Principal | ICD-10-CM | POA: Diagnosis present

## 2023-10-14 DIAGNOSIS — R002 Palpitations: Secondary | ICD-10-CM

## 2023-10-14 DIAGNOSIS — Z87891 Personal history of nicotine dependence: Secondary | ICD-10-CM

## 2023-10-14 DIAGNOSIS — I5032 Chronic diastolic (congestive) heart failure: Secondary | ICD-10-CM | POA: Diagnosis present

## 2023-10-14 LAB — COMPREHENSIVE METABOLIC PANEL, NON-FASTING
ALBUMIN/GLOBULIN RATIO: 1.9 — ABNORMAL HIGH (ref 0.8–1.4)
ALBUMIN: 4.7 g/dL (ref 3.5–5.7)
ALKALINE PHOSPHATASE: 71 U/L (ref 34–104)
ALT (SGPT): 17 U/L (ref 7–52)
ANION GAP: 10 mmol/L (ref 4–13)
AST (SGOT): 16 U/L (ref 13–39)
BILIRUBIN TOTAL: 0.6 mg/dL (ref 0.3–1.0)
BUN/CREA RATIO: 12 (ref 6–22)
BUN: 14 mg/dL (ref 7–25)
CALCIUM, CORRECTED: 9.4 mg/dL (ref 8.9–10.8)
CALCIUM: 10 mg/dL (ref 8.6–10.3)
CHLORIDE: 102 mmol/L (ref 98–107)
CO2 TOTAL: 27 mmol/L (ref 21–31)
CREATININE: 1.13 mg/dL (ref 0.60–1.30)
ESTIMATED GFR: 74 mL/min/{1.73_m2} (ref >59)
GLOBULIN: 2.5 (ref 2.0–3.5)
GLUCOSE: 155 mg/dL — ABNORMAL HIGH (ref 74–109)
OSMOLALITY, CALCULATED: 281 mosm/kg (ref 270–290)
POTASSIUM: 4 mmol/L (ref 3.5–5.1)
PROTEIN TOTAL: 7.2 g/dL (ref 6.4–8.9)
SODIUM: 139 mmol/L (ref 136–145)

## 2023-10-14 LAB — CBC WITH DIFF
BASOPHIL #: 0.1 10*3/uL (ref 0.00–0.10)
BASOPHIL %: 1 % (ref 0–1)
EOSINOPHIL #: 0.3 10*3/uL (ref 0.00–0.60)
EOSINOPHIL %: 3 % (ref 1–8)
HCT: 46.5 % (ref 36.7–47.1)
HGB: 15.8 g/dL (ref 12.5–16.3)
LYMPHOCYTE #: 3.1 10*3/uL — ABNORMAL HIGH (ref 1.00–3.00)
LYMPHOCYTE %: 30 % (ref 15–43)
MCH: 28.2 pg (ref 23.8–33.4)
MCHC: 34 g/dL (ref 32.5–36.3)
MCV: 82.8 fL (ref 73.0–96.2)
MONOCYTE #: 0.4 10*3/uL (ref 0.30–1.10)
MONOCYTE %: 4 % — ABNORMAL LOW (ref 6–14)
MPV: 7.7 fL (ref 7.4–11.4)
NEUTROPHIL #: 6.4 10*3/uL (ref 1.70–7.60)
NEUTROPHIL %: 63 % (ref 44–74)
PLATELETS: 308 10*3/uL (ref 140–440)
RBC: 5.61 10*6/uL (ref 4.06–5.63)
RDW: 13.4 % (ref 12.1–16.2)
WBC: 10.2 10*3/uL (ref 3.6–10.2)

## 2023-10-14 LAB — B-TYPE NATRIURETIC PEPTIDE (BNP),PLASMA: BNP: 28 pg/mL (ref 1–100)

## 2023-10-14 LAB — PTT (PARTIAL THROMBOPLASTIN TIME): APTT: 33.6 s (ref 25.0–38.0)

## 2023-10-14 LAB — TROPONIN-I
TROPONIN I: 6 ng/L (ref <20)
TROPONIN I: 5 ng/L (ref <20)
TROPONIN I: 6 ng/L (ref <20)

## 2023-10-14 LAB — MAGNESIUM: MAGNESIUM: 2.2 mg/dL (ref 1.9–2.7)

## 2023-10-14 LAB — PT/INR
INR: 1.03 (ref 0.84–1.10)
PROTHROMBIN TIME: 11.6 s (ref 9.8–12.7)

## 2023-10-14 LAB — THYROID STIMULATING HORMONE (SENSITIVE TSH): TSH: 4.065 u[IU]/mL (ref 0.450–5.330)

## 2023-10-14 MED ORDER — HEPARIN (PORCINE) 5,000 UNIT/ML INJECTION SOLUTION
5000.0000 [IU] | Freq: Three times a day (TID) | INTRAMUSCULAR | Status: DC
Start: 2023-10-15 — End: 2023-10-15
  Administered 2023-10-15 (×2): 5000 [IU] via SUBCUTANEOUS
  Filled 2023-10-14 (×2): qty 1

## 2023-10-14 MED ORDER — MESALAMINE 400 MG CAPSULE (WITH DELAYED RELEASE TABLETS INSIDE)
800.0000 mg | ORAL_CAPSULE | Freq: Two times a day (BID) | ORAL | Status: DC
Start: 2023-10-15 — End: 2023-10-15
  Administered 2023-10-15: 800 mg via ORAL
  Filled 2023-10-14 (×2): qty 2

## 2023-10-14 MED ORDER — CLONAZEPAM 1 MG TABLET
ORAL_TABLET | ORAL | Status: AC
Start: 2023-10-14 — End: 2023-10-14
  Filled 2023-10-14: qty 1

## 2023-10-14 MED ORDER — BUDESONIDE DR - ER 3 MG CAPSULE,DELAYED,EXTENDED RELEASE
9.0000 mg | DELAYED_RELEASE_CAPSULE | Freq: Every evening | ORAL | Status: DC
Start: 2023-10-15 — End: 2023-10-15
  Administered 2023-10-15: 0 mg via ORAL

## 2023-10-14 MED ORDER — ACETAMINOPHEN 325 MG TABLET
650.0000 mg | ORAL_TABLET | ORAL | Status: DC | PRN
Start: 2023-10-14 — End: 2023-10-15

## 2023-10-14 MED ORDER — PANTOPRAZOLE 40 MG TABLET,DELAYED RELEASE
40.0000 mg | DELAYED_RELEASE_TABLET | Freq: Every day | ORAL | Status: DC
Start: 2023-10-15 — End: 2023-10-15
  Administered 2023-10-15: 40 mg via ORAL
  Filled 2023-10-14: qty 1

## 2023-10-14 MED ORDER — CLONAZEPAM 1 MG TABLET
1.0000 mg | ORAL_TABLET | Freq: Two times a day (BID) | ORAL | Status: DC | PRN
Start: 2023-10-14 — End: 2023-10-15
  Administered 2023-10-14: 1 mg via ORAL

## 2023-10-14 NOTE — ED Nurses Note (Signed)
 This RN performed orthostatic blood pressures on patient at this time. Patient reported seeing black and having dizziness when asked to move from a lying to sitting position. Patient reported a flutter sensation and chest tightness upon standing for a final reading. Patient able to remain standing for test, and patient explained all symptoms throughout process to this RN. Patient was moving arm for some blood pressure readings so some results repeated. Provider made aware.

## 2023-10-14 NOTE — ED Notes (Signed)
 5/13 @ 2035: Pt Roy [name] called, was seen there yesterday, wants test results. Says he had another bad episode but can't drive there. CallerID: [phone]. Did not leave #.     Spoke to patient at this time, verified identity via name and DOB, introduced self and explained role in care. Patient wanting to know his labs and chest xray, let him know everything was normal. Patient states he had another chest pain episode but it resolved. Patient expression a lot of frustration in how swiftly he was made a follow up appt with cardiology, states his previous pcp is at fault for that and is no longer going to them but has found a new pcp he has an appt with. Patient advised that if he has more episodes of worsening chest pain to please return to his nearest ER for evaluation.

## 2023-10-14 NOTE — ED Triage Notes (Signed)
"  I feel like I'm going to pass out and my heart feels like it's in my throat" with sob off and on x one year increasing over the last two weeks and "It's getting really bad", seen at a [place] er for same c/o

## 2023-10-14 NOTE — ED APP Handoff Note (Signed)
 3   [facility] - Emergency Department  Emergency Department  Provider in Triage Note    Name: Roy [name]  Age: 63 y.o.  Gender: male     Subjective:   Roy [name] is a 63 y.o. male who presents with complaint of Chest Pain , Shortness of Breath, Irregular Heartbeat, and Dizziness  .      Objective:   Filed Vitals:    10/14/23 2030   BP: (!) 166/88   Pulse: 77   Resp: 20   Temp: 36.9 C (98.4 F)   SpO2: 99%      Focused Physical Exam shows complaint of chest pain and shortness of breath      Assessment:  A medical screening exam was completed.  This patient is a 63 y.o. male with initial findings showing Assessment:  A medical screening exam was completed.  This patient is a 63 y.o. male with initial findings showing of chest pain shortness of breath, irregular heartbeat dizziness, feels like he is going to pass out, this has been going on and off for about 1 year.  Patient was seen 2 days ago add a [facility] ER worked up for chest pain and discharged home to follow patient voices frustration at that.      Plan:  Please see initial orders and work-up below.  This is to be continued with full evaluation in the main Emergency Department.     No current facility-administered medications for this encounter.     Results for orders placed or performed during the hospital encounter of 10/14/23 (from the past 24 hours)   CBC/DIFF    Collection Time: 10/14/23  8:28 PM    Narrative    The following orders were created for panel order CBC/DIFF.  Procedure                               Abnormality         Status                     ---------                               -----------         ------                     CBC WITH ZOXW[[id number]]                                                                 Please view results for these tests on the individual orders.        Juwon Scripter, FNP  10/14/2023, 20:38

## 2023-10-14 NOTE — H&P (Incomplete)
 [facility]  Admission H&P    Date of Service: 10/14/2023   Roy [name], Roy [name], 63 y.o. male  Encounter Start Date:  10/14/2023  Inpatient Admission Date: 10/14/2023  Date of Birth:  12-08-1960  PCP: Quin Brush, DO      Information Obtained from: patient  Chief Complaint:  chest pain    HPI: Roy [name] is a 63 y.o., White male who presents with chest pain, dyspnea, and near syncope. His symptoms initially started about 2 years ago, and were very intermittent. At this time, he was mostly having intermittent palpitations and they were not severe. He had a stress test in 2023 which was normal. Since then, the episodes have become more frequent and associated with chest pain and tightness than radiates up into the neck and throat. They happen at rest and with exertion, and become worse with exertion. They are also associated with patient's vision blacking out and near syncope now. Patient saw Dr. 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 Crohn's disease (CMS HCC)    . Esophageal reflux    . Fatty liver     Per liver ultrasounds   . Panic disorder    . Right acoustic neuroma (CMS HCC)            Medications Prior to Admission       Prescriptions    APRISO 0.375 gram Oral Capsule, Sust. Release 24 hr    Take 4 Capsules (1.5 g total) by mouth Once a day    budesonide (ENTOCORT EC) 3 mg Oral Capsule, Delayed & Ext.Release    Take 3 Capsules (9 mg total) by mouth Every night    clonazePAM (KLONOPIN) 1 mg Oral Tablet    Take 1 Tablet (1 mg total) by mouth Twice per day as needed    mupirocin  (BACTROBAN ) 2 % Ointment    Apply topically Three times a day    omeprazole (PRILOSEC) 40 mg Oral Capsule, Delayed Release(E.C.)    Take 1 Capsule (40 mg total) by mouth Once a day    RED YEAST RICE ORAL    Take 1 Tablet by mouth Twice daily    TUMERIC-GING-OLIVE-OREG-CAPRYL ORAL    Take 1  Capsule by mouth Once a day          No Known Allergies  Past Surgical History:   Procedure Laterality Date   . HX NO SURGICAL PROCEDURES     . SKIN CANCER EXCISION              Family History:  Family Medical History:       Problem Relation (Age of Onset)    Pancreatitis Maternal Grandmother               Social History:  Social History     Tobacco Use   . Smoking status: Former     Current packs/day: 0.00     Average packs/day: 1 pack/day for 10.0 years (10.0 ttl pk-yrs)     Types: Cigarettes     Start date: 02/11/1983     Quit date: 02/10/1993     Years since quitting: 30.6   . Smokeless tobacco: Never   Vaping Use   . Vaping status: Never Used   Substance Use Topics   . Alcohol use: Not Currently     Comment: Prior  alcohol usage (6 pack beer daily in mid-late 1990's); Quit date year 2000   . Drug use: Not Currently        Review of Systems:  ROS     Examination:  Temperature: 36.9 C (98.4 F) Heart Rate: 73 BP (Non-Invasive): 116/74   Respiratory Rate: 12 SpO2: 93 %     Physical Exam     Labs:    Lab Results Today:    Results for orders placed or performed during the hospital encounter of 10/14/23 (from the past 24 hours)   COMPREHENSIVE METABOLIC PANEL, NON-FASTING   Result Value Ref Range    SODIUM 139 136 - 145 mmol/L    POTASSIUM 4.0 3.5 - 5.1 mmol/L    CHLORIDE 102 98 - 107 mmol/L    CO2 TOTAL 27 21 - 31 mmol/L    ANION GAP 10 4 - 13 mmol/L    BUN 14 7 - 25 mg/dL    CREATININE 7.25 3.66 - 1.30 mg/dL    BUN/CREA RATIO 12 6 - 22    ESTIMATED GFR 74 >59 mL/min/1.71m^2    ALBUMIN 4.7 3.5 - 5.7 g/dL    CALCIUM 44.0 8.6 - 34.7 mg/dL    GLUCOSE 425 (H) 74 - 109 mg/dL    ALKALINE PHOSPHATASE 71 34 - 104 U/L    ALT (SGPT) 17 7 - 52 U/L    AST (SGOT) 16 13 - 39 U/L    BILIRUBIN TOTAL 0.6 0.3 - 1.0 mg/dL    PROTEIN TOTAL 7.2 6.4 - 8.9 g/dL    ALBUMIN/GLOBULIN RATIO 1.9 (H) 0.8 - 1.4    OSMOLALITY, CALCULATED 281 270 - 290 mOsm/kg    CALCIUM, CORRECTED 9.4 8.9 - 10.8 mg/dL    GLOBULIN 2.5 2.0 - 3.5   PT/INR   Result  Value Ref Range    PROTHROMBIN TIME 11.6 9.8 - 12.7 seconds    INR 1.03 0.84 - 1.10   PTT (PARTIAL THROMBOPLASTIN TIME)   Result Value Ref Range    APTT 33.6 25.0 - 38.0 seconds   TROPONIN-I NOW   Result Value Ref Range    TROPONIN I 6 <20 ng/L   CBC WITH DIFF   Result Value Ref Range    WBC 10.2 3.6 - 10.2 x10^3/uL    RBC 5.61 4.06 - 5.63 x10^6/uL    HGB 15.8 12.5 - 16.3 g/dL    HCT 95.6 38.7 - 56.4 %    MCV 82.8 73.0 - 96.2 fL    MCH 28.2 23.8 - 33.4 pg    MCHC 34.0 32.5 - 36.3 g/dL    RDW 33.2 95.1 - 88.4 %    PLATELETS 308 140 - 440 x10^3/uL    MPV 7.7 7.4 - 11.4 fL    NEUTROPHIL % 63 44 - 74 %    LYMPHOCYTE % 30 15 - 43 %    MONOCYTE % 4 (L) 6 - 14 %    EOSINOPHIL % 3 1 - 8 %    BASOPHIL % 1 0 - 1 %    NEUTROPHIL # 6.40 1.70 - 7.60 x10^3/uL    LYMPHOCYTE # 3.10 (H) 1.00 - 3.00 x10^3/uL    MONOCYTE # 0.40 0.30 - 1.10 x10^3/uL    EOSINOPHIL # 0.30 0.00 - 0.60 x10^3/uL    BASOPHIL # 0.10 0.00 - 0.10 x10^3/uL   B-TYPE NATRIURETIC PEPTIDE   Result Value Ref Range    BNP 28 1 - 100 pg/mL   TROPONIN-I IN ONE HOUR  Result Value Ref Range    TROPONIN I 5 <20 ng/L   MAGNESIUM   Result Value Ref Range    MAGNESIUM 2.2 1.9 - 2.7 mg/dL   THYROID STIMULATING HORMONE (SENSITIVE TSH)   Result Value Ref Range    TSH 4.065 0.450 - 5.330 uIU/mL       Imaging Studies:  No results found.    DNR Status:  No Order    Assessment/Plan:   Active Hospital Problems    Diagnosis   . Primary Problem: Chest pain     ***    DVT/PE Prophylaxis: ***    Gilles Lacks, DO

## 2023-10-14 NOTE — H&P (Signed)
 [facility]  Admission H&P    Date of Service: 10/14/2023   Roy, [name], 63 y.o. male  Encounter Start Date:  10/14/2023  Inpatient Admission Date: 10/14/2023  Date of Birth:  1960-11-08  PCP: Roy Brush, DO      Information Obtained from: patient  Chief Complaint:  chest pain    HPI: Roy [name] is a 63 y.o., White male who presents with chest pain, dyspnea, and near syncope. His symptoms initially started about 2 years ago, and were very intermittent. At this time, he was mostly having intermittent palpitations and they were not severe. He had a stress test in 2023 which was normal. Since then, the episodes have become more frequent and associated with chest pain and tightness than radiates up into the neck and throat. They happen at rest and with exertion, and become worse with exertion. They are also associated with patient's vision blacking out and near syncope now. Patient saw Dr. 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 He was not started on any medications for it. He had an echocardiogram there several months ago and was told that part of his heart is enlarged.  His chest pain and near syncope are now occurring daily, sometimes multiple times per day. 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 Since arrival here, he has been in bigeminy and trigeminy on the monitor.    PAST MEDICAL:   Past Medical History:   Diagnosis Date    Crohn's disease (CMS HCC)     Esophageal reflux     Fatty liver     Per liver ultrasounds    Panic disorder     Right acoustic neuroma (CMS HCC)            Medications Prior to Admission       Prescriptions    APRISO 0.375 gram Oral Capsule, Sust. Release 24 hr    Take 4 Capsules (1.5 g total) by mouth Once a day    budesonide (ENTOCORT EC) 3 mg Oral Capsule, Delayed & Ext.Release    Take 3 Capsules (9 mg total) by  mouth Every night    clonazePAM (KLONOPIN) 1 mg Oral Tablet    Take 1 Tablet (1 mg total) by mouth Twice per day as needed    mupirocin  (BACTROBAN ) 2 % Ointment    Apply topically Three times a day    omeprazole (PRILOSEC) 40 mg Oral Capsule, Delayed Release(E.C.)    Take 1 Capsule (40 mg total) by mouth Once a day    RED YEAST RICE ORAL    Take 1 Tablet by mouth Twice daily    TUMERIC-GING-OLIVE-OREG-CAPRYL ORAL    Take 1 Capsule by mouth Once a day          No Known Allergies  Past Surgical History:   Procedure Laterality Date    HX NO SURGICAL PROCEDURES      SKIN CANCER EXCISION              Family History:  Family Medical History:       Problem Relation (Age of Onset)    Pancreatitis Maternal Grandmother               Social History:  Social History     Tobacco Use    Smoking status: Former     Current packs/day: 0.00  Average packs/day: 1 pack/day for 10.0 years (10.0 ttl pk-yrs)     Types: Cigarettes     Start date: 02/11/1983     Quit date: 02/10/1993     Years since quitting: 30.6    Smokeless tobacco: Never   Vaping Use    Vaping status: Never Used   Substance Use Topics    Alcohol use: Not Currently     Comment: Prior alcohol usage (6 pack beer daily in mid-late 1990's); Quit date year 2000    Drug use: Not Currently        Review of Systems:  Review of Systems   Cardiovascular:  Positive for chest pain, dyspnea on exertion, irregular heartbeat, near-syncope and palpitations (Patient has taken his wife's metoprolol 4-5 times in the past when his heart rate feels very high).   Respiratory:  Positive for shortness of breath.    Neurological:         Chronic acoustic neuroma   All other systems reviewed and are negative.       Examination:  Temperature: 36.1 C (97 F) Heart Rate: 55 BP (Non-Invasive): (!) 143/79   Respiratory Rate: 18 SpO2: 98 %     Physical Exam  Vitals and nursing note reviewed.   Constitutional:       General: He is not in acute distress.     Appearance: He is well-developed.   HENT:       Head: Normocephalic and atraumatic.   Eyes:      Conjunctiva/sclera: Conjunctivae normal.   Cardiovascular:      Rate and Rhythm: Normal rate and regular rhythm.      Heart sounds: No murmur heard.  Pulmonary:      Effort: Pulmonary effort is normal. No respiratory distress.      Breath sounds: Normal breath sounds.   Abdominal:      Palpations: Abdomen is soft.      Tenderness: There is no abdominal tenderness.   Musculoskeletal:         General: No swelling.      Cervical back: Neck supple.   Skin:     General: Skin is warm and dry.      Capillary Refill: Capillary refill takes less than 2 seconds.   Neurological:      Mental Status: He is alert.   Psychiatric:         Mood and Affect: Mood normal.          Labs:    Lab Results Today:    Results for orders placed or performed during the hospital encounter of 10/14/23 (from the past 24 hours)   COMPREHENSIVE METABOLIC PANEL, NON-FASTING   Result Value Ref Range    SODIUM 139 136 - 145 mmol/L    POTASSIUM 4.0 3.5 - 5.1 mmol/L    CHLORIDE 102 98 - 107 mmol/L    CO2 TOTAL 27 21 - 31 mmol/L    ANION GAP 10 4 - 13 mmol/L    BUN 14 7 - 25 mg/dL    CREATININE 1.61 0.96 - 1.30 mg/dL    BUN/CREA RATIO 12 6 - 22    ESTIMATED GFR 74 >59 mL/min/1.83m^2    ALBUMIN 4.7 3.5 - 5.7 g/dL    CALCIUM 04.5 8.6 - 40.9 mg/dL    GLUCOSE 811 (H) 74 - 109 mg/dL    ALKALINE PHOSPHATASE 71 34 - 104 U/L    ALT (SGPT) 17 7 - 52 U/L    AST (SGOT) 16 13 -  39 U/L    BILIRUBIN TOTAL 0.6 0.3 - 1.0 mg/dL    PROTEIN TOTAL 7.2 6.4 - 8.9 g/dL    ALBUMIN/GLOBULIN RATIO 1.9 (H) 0.8 - 1.4    OSMOLALITY, CALCULATED 281 270 - 290 mOsm/kg    CALCIUM, CORRECTED 9.4 8.9 - 10.8 mg/dL    GLOBULIN 2.5 2.0 - 3.5   PT/INR   Result Value Ref Range    PROTHROMBIN TIME 11.6 9.8 - 12.7 seconds    INR 1.03 0.84 - 1.10   PTT (PARTIAL THROMBOPLASTIN TIME)   Result Value Ref Range    APTT 33.6 25.0 - 38.0 seconds   TROPONIN-I NOW   Result Value Ref Range    TROPONIN I 6 <20 ng/L   CBC WITH DIFF   Result Value Ref Range     WBC 10.2 3.6 - 10.2 x10^3/uL    RBC 5.61 4.06 - 5.63 x10^6/uL    HGB 15.8 12.5 - 16.3 g/dL    HCT 65.7 84.6 - 96.2 %    MCV 82.8 73.0 - 96.2 fL    MCH 28.2 23.8 - 33.4 pg    MCHC 34.0 32.5 - 36.3 g/dL    RDW 95.2 84.1 - 32.4 %    PLATELETS 308 140 - 440 x10^3/uL    MPV 7.7 7.4 - 11.4 fL    NEUTROPHIL % 63 44 - 74 %    LYMPHOCYTE % 30 15 - 43 %    MONOCYTE % 4 (L) 6 - 14 %    EOSINOPHIL % 3 1 - 8 %    BASOPHIL % 1 0 - 1 %    NEUTROPHIL # 6.40 1.70 - 7.60 x10^3/uL    LYMPHOCYTE # 3.10 (H) 1.00 - 3.00 x10^3/uL    MONOCYTE # 0.40 0.30 - 1.10 x10^3/uL    EOSINOPHIL # 0.30 0.00 - 0.60 x10^3/uL    BASOPHIL # 0.10 0.00 - 0.10 x10^3/uL   B-TYPE NATRIURETIC PEPTIDE   Result Value Ref Range    BNP 28 1 - 100 pg/mL   TROPONIN-I IN ONE HOUR   Result Value Ref Range    TROPONIN I 5 <20 ng/L   MAGNESIUM   Result Value Ref Range    MAGNESIUM 2.2 1.9 - 2.7 mg/dL   THYROID STIMULATING HORMONE (SENSITIVE TSH)   Result Value Ref Range    TSH 4.065 0.450 - 5.330 uIU/mL   TROPONIN-I IN THREE HOURS   Result Value Ref Range    TROPONIN I 6 <20 ng/L       Imaging Studies:  No results found.    DNR Status:  No Order    Assessment/Plan:   Active Hospital Problems    Diagnosis    Primary Problem: Chest pain    Near syncope    Bigeminy     1)Chest pain  Pt does not know any information about his biological father, so family history is unknown.  We will admit patient and follow troponin.  Consult [facility] as it has been at least 2 years since patient had stress test.  We will do echocardiogram as patient is was abnormal as an outpatient a few months ago.  Add aspirin and prophylactic dose heparin.  We will not do metoprolol as patient has had a few cardiac pauses since arrival here and has near-syncope.    2)Near syncope  As per #1    3)Bigeminy  As per #1    DVT/PE Prophylaxis: heparin  Lotus Santillo D China Deitrick, DO

## 2023-10-14 NOTE — ED Provider Notes (Signed)
 [facility]  Emergency Department  Advanced Practice Provider Note      CHIEF COMPLAINT  Chief Complaint   Patient presents with    Chest Pain     Shortness of Breath    Irregular Heartbeat    Dizziness     HISTORY OF PRESENT ILLNESS  Roy [name], date of birth [birth year], is a 63 y.o. male who presented to the Emergency Department.    Patient is a 63 year old male, with a history of atrial fibrillation, who presents to the ED with complaint of chest pain, palpitations and syncope.  Also reports some shortness of breath especially with exertion.  Reports symptoms have been intermittent for the last year but worsening the last few weeks.  Reports he has been monitoring his heart rate at home which fluctuates from low 32 to high 160.  Also admits to taking his wife's Metoprolol to help with palpitations. States that he has followed up with a cardiologist and worn a Holter monitor.  Patient reports his last stress test was "probably 2 years ago."Denies being on any anticoagulant.  Denies any history of hypertension or hyperlipidemia.  Does admit to smoking a pack a day.     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   Tobacco Use    Smoking status: Former     Current packs/day: 0.00     Average packs/day: 1 pack/day for 10.0 years (10.0 ttl pk-yrs)     Types: Cigarettes     Start date: 02/11/1983     Quit date:  02/10/1993     Years since quitting: 30.6    Smokeless tobacco: Never   Vaping Use    Vaping status: Never Used   Substance and Sexual Activity    Alcohol use: Not Currently     Comment: Prior alcohol usage (6 pack beer daily in mid-late 1990's); Quit date year 2000    Drug use: Not Currently   Other Topics Concern    Right hand dominant Yes     Social Determinants of Health     Financial Resource Strain: Low Risk  (11/13/2021)    Financial Resource Strain     SDOH Financial: No   Transportation Needs: Low Risk  (11/13/2021)    Transportation Needs     SDOH Transportation: No   Social Connections: Low Risk  (11/13/2021)    Social Connections     SDOH Social Isolation: 5 or more times a week   Intimate Partner Violence: Low Risk  (11/13/2021)    Intimate Partner Violence     SDOH Domestic Violence: No   Housing Stability: Low Risk  (11/13/2021)  Housing Stability     SDOH Housing Situation: I have housing.     SDOH Housing Worry: No      ALLERGIES  No Known Allergies        PHYSICAL EXAM  VITAL SIGNS:  Filed Vitals:    10/14/23 2100 10/14/23 2115 10/14/23 2130 10/14/23 2145   BP: 135/82 113/85 122/78 114/83   Pulse: 89 97 76 79   Resp: 12 (!) 10 14    Temp:       SpO2: 96% 96% 95% 94%     Constitutional: Awake. Generally healthy appearing.  Average body weight. No distress noted. Communicates appropriately   Cardiovascular: Regular rate. S1, S2 with no murmur or gallop heard. No swelling to extremities  Pulmonary/Chest: Breath sounds clear and equal bilaterally. No wheezes, rales or chest tenderness. No respiratory distress.   Abdominal: Bowel sound normal. Abdomen soft, no tenderness, rebound or guarding.      Musculoskeletal: No tenderness or deformity. Normal muscle tone and strength.   Skin: warm and dry. No rash, redness, or bruising  Psychiatric: normal mood and affect. Behavior is normal.   Neurological: Alert, oriented. Normal gait. No focal weakness noted. No sensory deficit    Nursing notes reviewed.        DIAGNOSTICS  Labs:  Labs listed below were reviewed and interpreted by me.  Results for orders placed or performed during the hospital encounter of 10/14/23   COMPREHENSIVE METABOLIC PANEL, NON-FASTING   Result Value Ref Range    SODIUM 139 136 - 145 mmol/L    POTASSIUM 4.0 3.5 - 5.1 mmol/L    CHLORIDE 102 98 - 107 mmol/L    CO2 TOTAL 27 21 - 31 mmol/L    ANION GAP 10 4 - 13 mmol/L    BUN 14 7 - 25 mg/dL    CREATININE 6.04 5.40 - 1.30 mg/dL    BUN/CREA RATIO 12 6 - 22    ESTIMATED GFR 74 >59 mL/min/1.25m^2    ALBUMIN 4.7 3.5 - 5.7 g/dL    CALCIUM 98.1 8.6 - 19.1 mg/dL    GLUCOSE 478 (H) 74 - 109 mg/dL    ALKALINE PHOSPHATASE 71 34 - 104 U/L    ALT (SGPT) 17 7 - 52 U/L    AST (SGOT) 16 13 - 39 U/L    BILIRUBIN TOTAL 0.6 0.3 - 1.0 mg/dL    PROTEIN TOTAL 7.2 6.4 - 8.9 g/dL    ALBUMIN/GLOBULIN RATIO 1.9 (H) 0.8 - 1.4    OSMOLALITY, CALCULATED 281 270 - 290 mOsm/kg    CALCIUM, CORRECTED 9.4 8.9 - 10.8 mg/dL    GLOBULIN 2.5 2.0 - 3.5   PT/INR   Result Value Ref Range    PROTHROMBIN TIME 11.6 9.8 - 12.7 seconds    INR 1.03 0.84 - 1.10   PTT (PARTIAL THROMBOPLASTIN TIME)   Result Value Ref Range    APTT 33.6 25.0 - 38.0 seconds   TROPONIN-I NOW   Result Value Ref Range    TROPONIN I 6 <20 ng/L   TROPONIN-I IN ONE HOUR   Result Value Ref Range    TROPONIN I 5 <20 ng/L   CBC WITH DIFF   Result Value Ref Range    WBC 10.2 3.6 - 10.2 x10^3/uL    RBC 5.61 4.06 - 5.63 x10^6/uL    HGB 15.8 12.5 - 16.3 g/dL    HCT 29.5 62.1 - 30.8 %    MCV 82.8 73.0 - 96.2 fL  MCH 28.2 23.8 - 33.4 pg    MCHC 34.0 32.5 - 36.3 g/dL    RDW 47.8 29.5 - 62.1 %    PLATELETS 308 140 - 440 x10^3/uL    MPV 7.7 7.4 - 11.4 fL    NEUTROPHIL % 63 44 - 74 %    LYMPHOCYTE % 30 15 - 43 %    MONOCYTE % 4 (L) 6 - 14 %    EOSINOPHIL % 3 1 - 8 %    BASOPHIL % 1 0 - 1 %    NEUTROPHIL # 6.40 1.70 - 7.60 x10^3/uL    LYMPHOCYTE # 3.10 (H) 1.00 - 3.00 x10^3/uL    MONOCYTE # 0.40 0.30 - 1.10 x10^3/uL    EOSINOPHIL # 0.30 0.00 - 0.60 x10^3/uL    BASOPHIL # 0.10 0.00  - 0.10 x10^3/uL     Radiology:  Results for orders placed or performed during the hospital encounter of 10/14/23   XR AP MOBILE CHEST     Status: None    Narrative    Breyton Dain    RADIOLOGIST: Claudean Crumbly, MD    XR AP MOBILE CHEST performed on 10/14/2023 8:58 PM    CLINICAL HISTORY: Chest Pain  Chest Pain    TECHNIQUE: Frontal view of the chest.    COMPARISON:  Yesterday    FINDINGS:    The heart size is normal.   The lungs are clear.   No pleural fluid.        Impression    NO ACUTE FINDINGS.        Radiologist location ID: [id number]           ED COURSE/MEDICAL DECISION MAKING      ED Course as of 10/14/23 2212   Wed Oct 14, 2023   2202 WBC: 10.2  Normal. PMNs 63   2202 COMPREHENSIVE METABOLIC PANEL, NON-FASTING(!)  Electrolytes normal   2202 CREATININE: 1.13  GFR 74 BUN 14   2203 BILIRUBIN, TOTAL: 0.6  ALT 17 AST 16 Alk Phos 71   2203 TROPONIN-I: 6  Normal   2203 PROTHROMBIN TIME: 11.6  INR 1.03 PTT 33.6   2203 XR AP MOBILE CHEST     FINDINGS:     The heart size is normal.   The lungs are clear.   No pleural fluid.        IMPRESSION:  NO ACUTE FINDINGS.     2211 TROPONIN-I: 5  Normal      Medical Decision Making    Patient is a 63 year old male, with a history of atrial fibrillation, who presents to the ED with complaint of chest pain, palpitations and syncope.  Also reports some shortness of breath especially with exertion.  Reports symptoms have been intermittent for the last year but worsening the last few weeks.  Reports he has been monitoring his heart rate at home which fluctuates from low 32 to high 160.  Also admits to taking his wife's Metoprolol to help with palpitations. States that he has followed up with a cardiologist and worn a Holter monitor.  Patient reports his last stress test was "probably 2 years ago."Denies being on any anticoagulant.  Denies any history of hypertension or hyperlipidemia.  Does admit to smoking a pack a day.     Patient's list of differential diagnosis  includes but is not limite to cardiac dysrhythmia, myocardial infarction, orthostatic hypotension, thyroid disorder, pulmonary embolism, pneumonia, CHF    Patient's white count is normal at 10.2, neutrophils  63.  Electrolytes normal.  Renal function shows creatinine 1.13, GFR 74 and BUN 14.  Patient's initial and repeat troponin are normal.  Coags are normal with PT 11.6, INR 1.03 and PTT 33.6.  Total bilirubin 0.6, ALT 17, AST 16 and alk phos 71.  Chest x-ray shows no acute findings.    Discussed findings and plan of care with patient including admission for further evaluation.  Patient verbalizes understanding and agrees to this plan of care.  Reached out to admitting hospitalist, Dr. Emilia Harbour, to ask that patient be evaluated for admission.    Amount and/or Complexity of Data Reviewed  Radiology:  Decision-making details documented in ED Course.  ECG/medicine tests: independent interpretation performed.    Risk  Decision regarding hospitalization.        CLINICAL IMPRESSION  Clinical Impression   Chest pain, unspecified type (Primary)   Palpitations   Dyspnea on exertion   Syncope, unspecified syncope type     DISPOSITION  Admitted       DISCHARGE MEDICATIONS  Current Discharge Medication List            Maxine Spare- FNP-C, ENP-C 10/14/2023, 22:12   [facility]  Department of Emergency Medicine  [place]  [place]    This note was partially generated using MModal Fluency Direct system, and there may be some incorrect words, spellings, and punctuation that were not noted in checking the note before saving.    -----

## 2023-10-15 ENCOUNTER — Encounter (HOSPITAL_COMMUNITY): Payer: Self-pay | Admitting: Internal Medicine

## 2023-10-15 ENCOUNTER — Inpatient Hospital Stay (HOSPITAL_COMMUNITY)

## 2023-10-15 DIAGNOSIS — D333 Benign neoplasm of cranial nerves: Secondary | ICD-10-CM

## 2023-10-15 DIAGNOSIS — I498 Other specified cardiac arrhythmias: Secondary | ICD-10-CM | POA: Insufficient documentation

## 2023-10-15 DIAGNOSIS — R55 Syncope and collapse: Secondary | ICD-10-CM

## 2023-10-15 DIAGNOSIS — I5189 Other ill-defined heart diseases: Secondary | ICD-10-CM

## 2023-10-15 DIAGNOSIS — I5032 Chronic diastolic (congestive) heart failure: Secondary | ICD-10-CM

## 2023-10-15 DIAGNOSIS — R008 Other abnormalities of heart beat: Secondary | ICD-10-CM

## 2023-10-15 DIAGNOSIS — I491 Atrial premature depolarization: Secondary | ICD-10-CM

## 2023-10-15 LAB — BASIC METABOLIC PANEL
ANION GAP: 9 mmol/L (ref 4–13)
BUN/CREA RATIO: 15 (ref 6–22)
BUN: 14 mg/dL (ref 7–25)
CALCIUM: 9.4 mg/dL (ref 8.6–10.3)
CHLORIDE: 106 mmol/L (ref 98–107)
CO2 TOTAL: 25 mmol/L (ref 21–31)
CREATININE: 0.96 mg/dL (ref 0.60–1.30)
ESTIMATED GFR: 90 mL/min/{1.73_m2} (ref >59)
GLUCOSE: 110 mg/dL — ABNORMAL HIGH (ref 74–109)
OSMOLALITY, CALCULATED: 281 mosm/kg (ref 270–290)
POTASSIUM: 4 mmol/L (ref 3.5–5.1)
SODIUM: 140 mmol/L (ref 136–145)

## 2023-10-15 LAB — ECG 12 LEAD
Atrial Rate: 95 {beats}/min
Calculated P Axis: 56 degrees
Calculated R Axis: -14 degrees
Calculated T Axis: 28 degrees
PR Interval: 208 ms
QRS Duration: 92 ms
QT Interval: 354 ms
QTC Calculation: 444 ms
Ventricular rate: 95 {beats}/min

## 2023-10-15 LAB — CBC WITH DIFF
BASOPHIL #: 0.3 10*3/uL — ABNORMAL HIGH (ref 0.00–0.10)
BASOPHIL %: 3 % — ABNORMAL HIGH (ref 0–1)
EOSINOPHIL #: 0.3 10*3/uL (ref 0.00–0.60)
EOSINOPHIL %: 4 % (ref 1–8)
HCT: 45.7 % (ref 36.7–47.1)
HGB: 15.8 g/dL (ref 12.5–16.3)
LYMPHOCYTE #: 2.6 10*3/uL (ref 1.00–3.00)
LYMPHOCYTE %: 34 % (ref 15–43)
MCH: 28.7 pg (ref 23.8–33.4)
MCHC: 34.5 g/dL (ref 32.5–36.3)
MCV: 83.1 fL (ref 73.0–96.2)
MONOCYTE #: 0.4 10*3/uL (ref 0.30–1.10)
MONOCYTE %: 6 % (ref 6–14)
MPV: 8 fL (ref 7.4–11.4)
NEUTROPHIL #: 4 10*3/uL (ref 1.70–7.60)
NEUTROPHIL %: 53 % (ref 44–74)
PLATELETS: 292 10*3/uL (ref 140–440)
RBC: 5.5 10*6/uL (ref 4.06–5.63)
RDW: 13.5 % (ref 12.1–16.2)
WBC: 7.6 10*3/uL (ref 3.6–10.2)

## 2023-10-15 LAB — PHOSPHORUS: PHOSPHORUS: 5 mg/dL (ref 3.7–7.2)

## 2023-10-15 LAB — TROPONIN-I: TROPONIN I: 4 ng/L (ref <20)

## 2023-10-15 LAB — MAGNESIUM: MAGNESIUM: 2.2 mg/dL (ref 1.9–2.7)

## 2023-10-15 LAB — TRANSTHORACIC ECHOCARDIOGRAM - ADULT: EF MEASUREMENT VALUE: 50.6

## 2023-10-15 MED ORDER — METOPROLOL TARTRATE 25 MG TABLET
25.0000 mg | ORAL_TABLET | Freq: Two times a day (BID) | ORAL | 0 refills | Status: AC
Start: 2023-10-15 — End: 2023-11-14

## 2023-10-15 MED ORDER — GADOBUTROL 10 MMOL/10 ML (1 MMOL/ML) INTRAVENOUS SOLUTION
10.0000 mL | INTRAVENOUS | Status: AC
Start: 2023-10-15 — End: 2023-10-15
  Administered 2023-10-15: 8 mL via INTRAVENOUS

## 2023-10-15 MED ORDER — METOPROLOL TARTRATE 25 MG TABLET
25.0000 mg | ORAL_TABLET | Freq: Two times a day (BID) | ORAL | Status: DC
Start: 2023-10-15 — End: 2023-10-15
  Administered 2023-10-15: 25 mg via ORAL
  Filled 2023-10-15: qty 1

## 2023-10-15 MED ORDER — ASPIRIN 81 MG CHEWABLE TABLET
81.0000 mg | CHEWABLE_TABLET | Freq: Every day | ORAL | Status: DC
Start: 2023-10-15 — End: 2023-10-15
  Administered 2023-10-15: 81 mg via ORAL
  Filled 2023-10-15: qty 1

## 2023-10-15 NOTE — Nurses Notes (Signed)
 Patient discharged home at this time. Provided patient with discharge paperwork and informed patient how to proceed with getting Holter monitor on 5/16 and scheduled follow up appt with Dr. Rayma Calandra. IV and telemetry removed at time of discharge. Pt verbalized understanding.     Merceda Stairs, RN

## 2023-10-15 NOTE — Discharge Summary (Signed)
 [facility]  DISCHARGE SUMMARY    PATIENT NAME:  Roy [name], Roy [name]  MRN:  [id number]  DOB:  1960/06/03    ENCOUNTER DATE:  10/14/2023  INPATIENT ADMISSION DATE: 10/14/2023  DISCHARGE DATE:  10/15/2023    ATTENDING PHYSICIAN: Trinidad Funk, MD  SERVICE: PRN HOSPITALIST 5  PRIMARY CARE PHYSICIAN: Quin Brush, DO       No lay caregiver identified.    PRIMARY DISCHARGE DIAGNOSIS: Chest pain  Active Hospital Problems    Diagnosis Date Noted    Principal Problem: Chest pain [R07.9] 10/14/2023    Near syncope [R55] 10/15/2023    Bigeminy [I49.8] 10/15/2023      Resolved Hospital Problems   No resolved problems to display.     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        Details   metoprolol tartrate 25 mg Tablet  Commonly known as: LOPRESSOR   25 mg, Oral, 2 TIMES DAILY  Qty: 60 Tablet  Refills: 0            CONTINUE these medications - NO CHANGES were made during your visit.        Details   Apriso 0.375 gram Capsule, Sust. Release 24 hr  Generic drug: Mesalamine   1.5 g, Oral, Daily  Refills: 0     clonazePAM 1 mg Tablet  Commonly known as: klonoPIN   1 mg, Oral, 2 TIMES DAILY PRN  Refills: 0     omeprazole 40 mg Capsule, Delayed Release(E.C.)  Commonly known as: PRILOSEC   40 mg, Oral, Daily  Refills: 0            STOP taking these medications.      budesonide 3 mg Capsule, Delayed & Ext.Release  Commonly known as: ENTOCORT EC     mupirocin  2 % Ointment  Commonly known as: BACTROBAN      RED YEAST RICE ORAL     TUMERIC-GING-OLIVE-OREG-CAPRYL ORAL            Discharge med list refreshed?  YES     No Known Allergies  HOSPITAL PROCEDURE(S):   No orders of the defined types were placed in this encounter.      REASON FOR HOSPITALIZATION AND HOSPITAL COURSE   BRIEF HPI:  This is a 63 y.o., male admitted for chest  pain/near-syncope      BRIEF HOSPITAL NARRATIVE:   It was elected to admit the patient for further evaluation in the setting of the aforementioned.  The patient was ruled out for acute coronary syndrome with cardiac enzymes and telemetry monitoring was unremarkable for any clinically significant rhythm disturbances throughout the duration of the hospitalization.  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  He was not in acute CHF exacerbation at the time that he presented.  He was evaluated by the Cardiology team and adjustments were made to his regimen.  Plans were made for the patient to be discharged home with a Holter monitor and he will follow up with his cardiologist in 1 week for further care.  The patient was offered the option of remaining hospitalized until a Holter device could be  secured for him in the a.m..  He refused this option and expressed wishes to return home as soon as possible in no uncertain terms citing his inability to sleep well while in the hospital.  Given his symptom profile at presentation, the patient was instructed to refrain from using caffeinated beverages in order to minimize the potential for cardiac excitability. The patient we will continue to follow up with his PCP and cardiologist in the near future for continued management of his chronic medical issues.    TRANSITION/POST DISCHARGE CARE/PENDING TESTS/REFERRALS:  PCP/Cardiology    CONDITION ON DISCHARGE:  A. Ambulation: Full ambulation  B. Self-care Ability: Complete  C. Cognitive Status Alert and Oriented x 3  D. Code status at discharge:   Full code    Physical Exam  Gen: NAD, pleasant, alert/oriented x 3, speaking in full sentences  HEENT:  Atraumatic, moist mucous membranes  Neck: supple, no TM  Heart:  Normal S1/S2, no extra heart sounds noted  Chest:  Clear to auscultation bilaterally, good aeration to  bases  Abdomen:+BS, soft,NT/ND, no rebound/guarding, normal contour  Extr: no c/c/e  Neuro:  No focal deficits       LINES/DRAINS/WOUNDS AT DISCHARGE:   Patient Lines/Drains/Airways Status       Active Line / Dialysis Catheter / Dialysis Graft / Drain / Airway / Wound       Name Placement date Placement time Site Days    Peripheral IV Right Basilic  (medial side of arm) 10/14/23  2318  -- less than 1                    DISCHARGE DISPOSITION:  Home discharge  DISCHARGE INSTRUCTIONS:  Post-Discharge Follow Up Appointments       Follow up with Quin Brush, DO in 1 week    Phone: [phone]    Where: [address], [place] [address]      Thursday Nov 05, 2023    Follow up with Wynette Heckler, MD    Phone: [phone]    Where: [facility]      Tuesday Dec 01, 2023    Return Patient Visit with Arma Berkshire, PA-C at  9:15 AM      Urology, [facility] [place], [place]  [address]  [place] [address]  [phone]             7 DAY EXTENDED HOLTER MONITOR           Pediatric or Adult Adult    Reason for Exam Cardiac arrhythmia [190970]    Does the patient have any of the following clinical protocols: chronic afib, post TAVR, or post ablation? No           Trinidad Funk, MD    Copies sent to Care Team         Relationship Specialty Notifications Start End    Peters, Jana, DO PCP - General EXTERNAL  02/04/17     Phone: [phone] Fax: [phone]         [address] [place] [address]            Referring providers can utilize [email or web address] to access their referred [facility] Medicine patient's information.

## 2023-10-15 NOTE — Care Plan (Signed)
 Problem: Adult Inpatient Plan of Care  Goal: Plan of Care Review  Reactivated  Goal: Patient-Specific Goal (Individualized)  Reactivated  Flowsheets (Taken 10/15/2023 0142)  Individualized Care Needs: manage/treat pain/  Anxieties, Fears or Concerns: none voiced  Goal: Absence of Hospital-Acquired Illness or Injury  Reactivated  Goal: Optimal Comfort and Wellbeing  Reactivated  Goal: Rounds/Family Conference  Reactivated     Problem: Health Knowledge, Opportunity to Enhance (Adult,Obstetrics,Pediatric)  Goal: Knowledgeable about Health Subject/Topic  Description: Patient will demonstrate the desired outcomes by discharge/transition of care.  Reactivated

## 2023-10-15 NOTE — Care Plan (Signed)
 Problem: Adult Inpatient Plan of Care  Goal: Plan of Care Review  Outcome: Ongoing (see interventions/notes)  Goal: Patient-Specific Goal (Individualized)  Outcome: Ongoing (see interventions/notes)  Flowsheets (Taken 10/15/2023 0815)  Individualized Care Needs: MRI, echo today. Monitor vitals, symptoms.  Anxieties, Fears or Concerns: Concerned about chest pain.  Goal: Absence of Hospital-Acquired Illness or Injury  Outcome: Ongoing (see interventions/notes)  Intervention: Identify and Manage Fall Risk  Recent Flowsheet Documentation  Taken 10/15/2023 0815 by Freddy Jain, RN  Safety Promotion/Fall Prevention: safety round/check completed  Intervention: Prevent Skin Injury  Recent Flowsheet Documentation  Taken 10/15/2023 0815 by Freddy Jain, RN  Body Position: supine, head elevated  Intervention: Prevent and Manage VTE (Venous Thromboembolism) Risk  Recent Flowsheet Documentation  Taken 10/15/2023 0815 by Freddy Jain, RN  VTE Prevention/Management: ambulation promoted  Goal: Optimal Comfort and Wellbeing  Outcome: Ongoing (see interventions/notes)  Intervention: Provide Person-Centered Care  Recent Flowsheet Documentation  Taken 10/15/2023 0815 by Freddy Jain, RN  Trust Relationship/Rapport:   care explained   choices provided  Goal: Rounds/Family Conference  Outcome: Ongoing (see interventions/notes)     Problem: Health Knowledge, Opportunity to Enhance (Adult,Obstetrics,Pediatric)  Goal: Knowledgeable about Health Subject/Topic  Description: Patient will demonstrate the desired outcomes by discharge/transition of care.  Outcome: Ongoing (see interventions/notes)     Problem: Fall Injury Risk  Goal: Absence of Fall and Fall-Related Injury  Outcome: Ongoing (see interventions/notes)  Intervention: Identify and Manage Contributors  Recent Flowsheet Documentation  Taken 10/15/2023 0815 by Freddy Jain, RN  Medication Review/Management: medications reviewed  Intervention: Promote Injury-Free Environment  Recent Flowsheet Documentation  Taken  10/15/2023 0815 by Freddy Jain, RN  Safety Promotion/Fall Prevention: safety round/check completed

## 2023-10-15 NOTE — Discharge Instructions (Signed)
 [facility] scheduling department will call in the morning to make arrangements for 7 day Holter monitor. Follow up with Dr. Deon Flatter office on June 5th. Call 911, return to the ER, or contact your PCP if chest pain gets worse or you begin to experience more frequent episodes of dizziness, black out spells, difficulty breathing.

## 2023-10-15 NOTE — ED Nurses Note (Signed)
 Report called to 3S at this time.

## 2023-10-15 NOTE — Consults (Signed)
 [facility]   Cardiology Consult    [place], Ohlman, 63 y.o. male  Encounter Start Date:  10/14/2023  Inpatient Admission Date: 10/14/2023   Date of Service: 10/15/2023  Date of Birth:  March 23, 1961  PCP:  Quin Brush, DO    Information Obtained from: patient and history reviewed via medical record  Chief Complaint:  Chest, pain near syncope  Reason for Consult: Chest pain, near syncope    Impression/Recommendation:  Paroxysmal Atrial fibrillation, [place] Vasc-0  -continue metoprolol tartrate 25 mg BD  - target serum potassium more than 4 and serum magnesium more than 2    Holter monitor for 7 days at the time of discharge  Follow-up with the Dr. Rayma Calandra with a Holter monitor report      The patient was seen by me and examined as part of a shared service with the mid-level provider. The patient was clinically evaluated, and all pertinent lab results and imaging were reviewed. I reviewed the mid-level provider note and made any substantive changes necessary. I agree with the mid-level provider note, which reflects my medical decision making.     HPI:  Roy [name] is a 63 y.o. male with past medical history significant for reported paroxysmal atrial fibrillation. Patient presented to the ER for chest pain, shortness of breath and near syncopal episodes. Patient had recent visit to [facility] ER 3 days ago for similar symptoms. Workup at that time was unremarkable. Patient states that symptoms initially started around 2 years ago but have become more frequent and severe. Reports that they are almost a daily occurrence now. He reports that he works in [employer] and will have episodes multiple times a day now. He will develop tightness in his chest that radiates into his neck with shortness of breath and palpitations while working. He feels like he could pass out at times. He will rest and symptoms will improve. He recently started seeing Dr. Deon Flatter and was apparently diagnosed with paroxysmal atrial  fibrillation but was not started on any medications for this. He had a exercise stress test in 2023 which was normal. He also reportedly had echocardiogram in [facility]'s office several months ago and was told that part of his heart was enlarged. Lab work showed K 4.0, Mg 2.2, TSH 4.065, Troponin 6, 5, 6, 4. EKG sinus rhythm at 95 with PACs. Chads-Vasc score 0.     Assessment:  Active Hospital Problems    Diagnosis    Primary Problem: Chest pain    Near syncope    Bigeminy      Past Medical History:   Diagnosis Date    Crohn's disease     Esophageal reflux     Fatty liver     Per liver ultrasounds    Panic disorder     Right acoustic neuroma (CMS HCC)          Past Surgical History:   Procedure Laterality Date    HX NO SURGICAL PROCEDURES      SKIN CANCER EXCISION           Social History     Socioeconomic History    Marital status: Married     Spouse name: Not on file    Number of children: 0    Years of education: Not on file    Highest education level: Not on file   Occupational History    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   Tobacco Use    Smoking status: Former  Current packs/day: 0.00     Average packs/day: 1 pack/day for 10.0 years (10.0 ttl pk-yrs)     Types: Cigarettes     Start date: 02/11/1983     Quit date: 02/10/1993     Years since quitting: 30.6    Smokeless tobacco: Never   Vaping Use    Vaping status: Never Used   Substance and Sexual Activity    Alcohol use: Not Currently     Comment: Prior alcohol usage (6 pack beer daily in mid-late 1990's); Quit date year 2000    Drug use: Not Currently    Sexual activity: Not on file   Other Topics Concern    Uses Cane Not Asked    Uses walker Not Asked    Uses wheelchair Not Asked    Right hand dominant Yes    Left hand dominant Not Asked    Ambidextrous Not Asked    Shift Work Not Asked    Unusual Sleep-Wake Schedule Not Asked   Social History Narrative    Not on file     Social Determinants of Health     Financial Resource Strain: Low  Risk  (11/13/2021)    Financial Resource Strain     SDOH Financial: No   Transportation Needs: Low Risk  (11/13/2021)    Transportation Needs     SDOH Transportation: No   Social Connections: Medium Risk (10/15/2023)    Social Connections     SDOH Social Isolation: 3 to 5 times a week   Intimate Partner Violence: Low Risk  (11/13/2021)    Intimate Partner Violence     SDOH Domestic Violence: No   Housing Stability: Low Risk  (11/13/2021)    Housing Stability     SDOH Housing Situation: I have housing.     SDOH Housing Worry: No      Family Medical History:       Problem Relation (Age of Onset)    Pancreatitis Maternal Grandmother             Prior to Admission Medications:  Medications Prior to Admission       Prescriptions    APRISO 0.375 gram Oral Capsule, Sust. Release 24 hr    Take 4 Capsules (1.5 g total) by mouth Once a day    budesonide (ENTOCORT EC) 3 mg Oral Capsule, Delayed & Ext.Release    Take 3 Capsules (9 mg total) by mouth Every night    Patient not taking:  Reported on 10/15/2023    clonazePAM (KLONOPIN) 1 mg Oral Tablet    Take 1 Tablet (1 mg total) by mouth Twice per day as needed    mupirocin  (BACTROBAN ) 2 % Ointment    Apply topically Three times a day    Patient not taking:  Reported on 10/15/2023    omeprazole (PRILOSEC) 40 mg Oral Capsule, Delayed Release(E.C.)    Take 1 Capsule (40 mg total) by mouth Daily    RED YEAST RICE ORAL    Take 1 Tablet by mouth Twice daily    Patient not taking:  Reported on 10/15/2023    TUMERIC-GING-OLIVE-OREG-CAPRYL ORAL    Take 1 Capsule by mouth Once a day    Patient not taking:  Reported on 10/15/2023          No Known Allergies  ROS: Other than ROS in the HPI, all other systems were negative.   Exam:  Temperature: 36.8 C (98.3 F)  Heart Rate: 71  BP (Non-Invasive): 123/82  Respiratory Rate: 15  SpO2: 100 %  General: No acute distress and appears stated age.    HEENT: Head normocephalic, atraumatic.  Mucouse membranes moist.    Neck: No JVD, no carotid bruit.     Lungs: Clear to auscultation bilaterally.    Cardiovascular: Regular rate and rhythm, normal S1-S2 without murmur, gallop, or rub.    Abdomen: Soft, non-tender and bowel sounds normal.    Extremities: Extremities normal, atraumatic, no cyanosis or edema.    Skin: Skin warm and dry.    Neurologic: Alert and oriented x3.  Psych: Mood and affect congruent for age and gender     Patient seen and examined by myself and Dr. Judithann Novas.  Assessment and plan discussed and agreed upon as documented.       Jeris Montes, PA-C 10/15/2023 13:43

## 2023-11-05 ENCOUNTER — Other Ambulatory Visit (HOSPITAL_COMMUNITY): Payer: Self-pay | Admitting: Internal Medicine

## 2023-11-05 ENCOUNTER — Ambulatory Visit
Admission: RE | Admit: 2023-11-05 | Discharge: 2023-11-05 | Disposition: A | Discharge status: Home / Self Care | Source: Ambulatory Visit | Attending: Internal Medicine | Admitting: Internal Medicine

## 2023-11-05 ENCOUNTER — Other Ambulatory Visit: Payer: Self-pay

## 2023-11-05 DIAGNOSIS — I4891 Unspecified atrial fibrillation: Secondary | ICD-10-CM

## 2023-11-05 DIAGNOSIS — R55 Syncope and collapse: Secondary | ICD-10-CM

## 2023-11-05 DIAGNOSIS — R002 Palpitations: Secondary | ICD-10-CM

## 2023-11-19 LAB — 7 DAY EXTENDED HOLTER MONITOR
Heart rate (average): 69 {beats}/min
Isolated SVE count: 140 episodes
Isolated VE Counts: 276 episodes
Longest ventricular tachycardia episode - duration: 2.3
Longest ventricular tachycardia episode - heart rate (avera: 104
Longest ventricular tachycardia episode - number of beats: 4 beats
SVE Couplets Counts: 6 episodes
SVE Triplets Counts: 1 episodes
Ventricular tachycardia - heart rate (average): 104 {beats}/min
Ventricular tachycardia - number of episodes: 1
Ventricular tachycardia with fastest heart rate - duration: 2.3 s
Ventricular tachycardia with fastest heart rate - heart rat: 104
Ventricular tachycardia with fastest heart rate - number of: 4 beats

## 2023-11-24 ENCOUNTER — Other Ambulatory Visit (INDEPENDENT_AMBULATORY_CARE_PROVIDER_SITE_OTHER): Payer: Self-pay | Admitting: Physician Assistant

## 2023-11-24 DIAGNOSIS — Z125 Encounter for screening for malignant neoplasm of prostate: Secondary | ICD-10-CM

## 2023-12-01 ENCOUNTER — Encounter (INDEPENDENT_AMBULATORY_CARE_PROVIDER_SITE_OTHER): Admitting: Physician Assistant

## 2023-12-03 ENCOUNTER — Encounter (HOSPITAL_COMMUNITY): Payer: Self-pay

## 2024-01-13 ENCOUNTER — Encounter (INDEPENDENT_AMBULATORY_CARE_PROVIDER_SITE_OTHER): Admitting: Physician Assistant
